# Patient Record
Sex: Female | Born: 1995 | Race: White | Hispanic: No | Marital: Single
Health system: Southern US, Community
[De-identification: ages and names within clinical notes are randomized; demographics above are authoritative.]

## PROBLEM LIST (undated history)

## (undated) DIAGNOSIS — F329 Major depressive disorder, single episode, unspecified: Secondary | ICD-10-CM

## (undated) DIAGNOSIS — F32A Depression, unspecified: Secondary | ICD-10-CM

## (undated) DIAGNOSIS — F419 Anxiety disorder, unspecified: Secondary | ICD-10-CM

---
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
## 2017-01-29 ENCOUNTER — Other Ambulatory Visit: Payer: Self-pay | Admitting: Student

## 2017-01-29 DIAGNOSIS — R1030 Lower abdominal pain, unspecified: Secondary | ICD-10-CM

## 2017-01-29 DIAGNOSIS — R1013 Epigastric pain: Secondary | ICD-10-CM

## 2017-01-29 DIAGNOSIS — R112 Nausea with vomiting, unspecified: Secondary | ICD-10-CM

## 2017-02-05 ENCOUNTER — Ambulatory Visit
Admission: RE | Admit: 2017-02-05 | Discharge: 2017-02-05 | Disposition: A | Discharge status: Home / Self Care | Payer: Medicaid Other | Source: Ambulatory Visit | Attending: Student | Admitting: Student

## 2017-02-05 ENCOUNTER — Other Ambulatory Visit
Admission: RE | Admit: 2017-02-05 | Discharge: 2017-02-05 | Disposition: A | Discharge status: Home / Self Care | Payer: Medicaid Other | Source: Ambulatory Visit | Attending: Student | Admitting: Student

## 2017-02-05 DIAGNOSIS — Z3200 Encounter for pregnancy test, result unknown: Secondary | ICD-10-CM | POA: Insufficient documentation

## 2017-02-05 LAB — URINALYSIS, COMPLETE (UACMP) WITH MICROSCOPIC
Bacteria, UA: NONE SEEN
Bilirubin Urine: NEGATIVE
Glucose, UA: NEGATIVE mg/dL
Hgb urine dipstick: NEGATIVE
KETONES UR: NEGATIVE mg/dL
Nitrite: NEGATIVE
PROTEIN: NEGATIVE mg/dL
Specific Gravity, Urine: 1.023 (ref 1.005–1.030)
pH: 6 (ref 5.0–8.0)

## 2017-02-06 ENCOUNTER — Other Ambulatory Visit
Admission: RE | Admit: 2017-02-06 | Discharge: 2017-02-06 | Disposition: A | Discharge status: Home / Self Care | Payer: Medicaid Other | Source: Ambulatory Visit | Attending: Student | Admitting: Student

## 2017-02-06 ENCOUNTER — Ambulatory Visit
Admission: RE | Admit: 2017-02-06 | Discharge: 2017-02-06 | Disposition: A | Discharge status: Home / Self Care | Payer: Medicaid Other | Source: Ambulatory Visit | Attending: Student | Admitting: Student

## 2017-02-06 DIAGNOSIS — R112 Nausea with vomiting, unspecified: Secondary | ICD-10-CM | POA: Diagnosis present

## 2017-02-06 DIAGNOSIS — R1013 Epigastric pain: Secondary | ICD-10-CM

## 2017-02-06 DIAGNOSIS — R195 Other fecal abnormalities: Secondary | ICD-10-CM | POA: Insufficient documentation

## 2017-02-06 DIAGNOSIS — R109 Unspecified abdominal pain: Secondary | ICD-10-CM | POA: Diagnosis present

## 2017-02-06 DIAGNOSIS — R1030 Lower abdominal pain, unspecified: Secondary | ICD-10-CM

## 2017-02-06 DIAGNOSIS — N281 Cyst of kidney, acquired: Secondary | ICD-10-CM | POA: Diagnosis not present

## 2017-02-06 DIAGNOSIS — Z3200 Encounter for pregnancy test, result unknown: Secondary | ICD-10-CM | POA: Diagnosis not present

## 2017-02-06 DIAGNOSIS — Z01818 Encounter for other preprocedural examination: Secondary | ICD-10-CM | POA: Insufficient documentation

## 2017-02-06 LAB — PREGNANCY, URINE: Preg Test, Ur: NEGATIVE

## 2017-02-06 MED ORDER — IOPAMIDOL (ISOVUE-300) INJECTION 61%
100.0000 mL | Freq: Once | INTRAVENOUS | Status: AC | PRN
  Administered 2017-02-06: 100 mL via INTRAVENOUS

## 2017-07-25 ENCOUNTER — Encounter: Payer: Self-pay | Admitting: Behavioral Health

## 2017-07-25 ENCOUNTER — Emergency Department
Admission: EM | Admit: 2017-07-25 | Discharge: 2017-07-25 | Disposition: A | Discharge status: Home / Self Care | Payer: Medicaid Other | Attending: Emergency Medicine | Admitting: Emergency Medicine

## 2017-07-25 ENCOUNTER — Other Ambulatory Visit: Payer: Self-pay

## 2017-07-25 DIAGNOSIS — F331 Major depressive disorder, recurrent, moderate: Secondary | ICD-10-CM

## 2017-07-25 DIAGNOSIS — F1210 Cannabis abuse, uncomplicated: Secondary | ICD-10-CM | POA: Diagnosis not present

## 2017-07-25 DIAGNOSIS — R030 Elevated blood-pressure reading, without diagnosis of hypertension: Secondary | ICD-10-CM | POA: Insufficient documentation

## 2017-07-25 DIAGNOSIS — Z046 Encounter for general psychiatric examination, requested by authority: Secondary | ICD-10-CM | POA: Diagnosis present

## 2017-07-25 DIAGNOSIS — F4310 Post-traumatic stress disorder, unspecified: Secondary | ICD-10-CM

## 2017-07-25 DIAGNOSIS — R45851 Suicidal ideations: Secondary | ICD-10-CM | POA: Insufficient documentation

## 2017-07-25 DIAGNOSIS — F329 Major depressive disorder, single episode, unspecified: Secondary | ICD-10-CM | POA: Diagnosis not present

## 2017-07-25 DIAGNOSIS — F32A Depression, unspecified: Secondary | ICD-10-CM

## 2017-07-25 DIAGNOSIS — R1115 Cyclical vomiting syndrome unrelated to migraine: Secondary | ICD-10-CM

## 2017-07-25 DIAGNOSIS — F603 Borderline personality disorder: Secondary | ICD-10-CM

## 2017-07-25 HISTORY — DX: Anxiety disorder, unspecified: F41.9

## 2017-07-25 HISTORY — DX: Major depressive disorder, single episode, unspecified: F32.9

## 2017-07-25 HISTORY — DX: Depression, unspecified: F32.A

## 2017-07-25 LAB — URINALYSIS, COMPLETE (UACMP) WITH MICROSCOPIC
BACTERIA UA: NONE SEEN
Bilirubin Urine: NEGATIVE
Glucose, UA: NEGATIVE mg/dL
KETONES UR: NEGATIVE mg/dL
Leukocytes, UA: NEGATIVE
Nitrite: NEGATIVE
Protein, ur: NEGATIVE mg/dL
Specific Gravity, Urine: 1.009 (ref 1.005–1.030)
pH: 6 (ref 5.0–8.0)

## 2017-07-25 LAB — COMPREHENSIVE METABOLIC PANEL
ALK PHOS: 79 U/L (ref 38–126)
ALT: 26 U/L (ref 14–54)
AST: 29 U/L (ref 15–41)
Albumin: 4.4 g/dL (ref 3.5–5.0)
Anion gap: 11 (ref 5–15)
BUN: 9 mg/dL (ref 6–20)
CALCIUM: 9.9 mg/dL (ref 8.9–10.3)
CHLORIDE: 109 mmol/L (ref 101–111)
CO2: 23 mmol/L (ref 22–32)
CREATININE: 0.64 mg/dL (ref 0.44–1.00)
GFR calc Af Amer: >60 mL/min (ref >60)
GFR calc non Af Amer: >60 mL/min (ref >60)
Glucose, Bld: 115 mg/dL — ABNORMAL HIGH (ref 65–99)
Potassium: 3.3 mmol/L — ABNORMAL LOW (ref 3.5–5.1)
SODIUM: 143 mmol/L (ref 135–145)
Total Bilirubin: 0.9 mg/dL (ref 0.3–1.2)
Total Protein: 8.4 g/dL — ABNORMAL HIGH (ref 6.5–8.1)

## 2017-07-25 LAB — URINE DRUG SCREEN, QUALITATIVE (ARMC ONLY)
AMPHETAMINES, UR SCREEN: NOT DETECTED
BENZODIAZEPINE, UR SCRN: NOT DETECTED
Barbiturates, Ur Screen: NOT DETECTED
Cannabinoid 50 Ng, Ur ~~LOC~~: POSITIVE — AB
Cocaine Metabolite,Ur ~~LOC~~: NOT DETECTED
MDMA (Ecstasy)Ur Screen: NOT DETECTED
METHADONE SCREEN, URINE: NOT DETECTED
Opiate, Ur Screen: NOT DETECTED
PHENCYCLIDINE (PCP) UR S: NOT DETECTED
TRICYCLIC, UR SCREEN: NOT DETECTED

## 2017-07-25 LAB — CBC
HCT: 44.5 % (ref 35.0–47.0)
HEMOGLOBIN: 14.4 g/dL (ref 12.0–16.0)
MCH: 27.3 pg (ref 26.0–34.0)
MCHC: 32.4 g/dL (ref 32.0–36.0)
MCV: 84.2 fL (ref 80.0–100.0)
PLATELETS: 293 10*3/uL (ref 150–440)
RBC: 5.29 MIL/uL — AB (ref 3.80–5.20)
RDW: 15.4 % — ABNORMAL HIGH (ref 11.5–14.5)
WBC: 10.2 10*3/uL (ref 3.6–11.0)

## 2017-07-25 LAB — SALICYLATE LEVEL

## 2017-07-25 LAB — POCT PREGNANCY, URINE: Preg Test, Ur: NEGATIVE

## 2017-07-25 LAB — ETHANOL: Alcohol, Ethyl (B): 74 mg/dL — ABNORMAL HIGH (ref <10)

## 2017-07-25 LAB — ACETAMINOPHEN LEVEL: Acetaminophen (Tylenol), Serum: <10 ug/mL — ABNORMAL LOW (ref 10–30)

## 2017-07-25 MED ORDER — ONDANSETRON 4 MG PO TBDP
4.0000 mg | ORAL_TABLET | Freq: Once | ORAL | Status: AC
  Administered 2017-07-25: 4 mg via ORAL

## 2017-07-25 MED ORDER — ONDANSETRON 4 MG PO TBDP
ORAL_TABLET | ORAL | Status: AC
  Filled 2017-07-25: qty 1

## 2017-07-25 NOTE — BH Assessment (Signed)
Per Dr. Toni Amendlapacs, pt will be discharged.

## 2017-07-25 NOTE — BH Assessment (Signed)
Assessment Note  Stacey Jensen is an 22 y.o. female presenting to the ED under IVC by [facility] for depression and suicidal ideations without plan or intent.  Patient currently denies SI.  She states that she sent her sister a text stating "I know I will eventually die one day but not tonight".    Patient has a family history of trauma and turmoil.  Patient's father committed suicide when patient was in the ninth grade.  Her sister had a suicide attempt 1 month ago and required admission to ICU.  Patient's mother left the family when patient and her sister were young.  Patient and her sister were raised by their grandparents.  Patient reports receiving medication management at [facility] but says she does not like taking medications and flushed them down the toilet.  She reports receiving outpatient therapy in the past but says she stopped when she started feeling better.  Patient denies HI and auditory/visual hallucinations.  She denies regular drug use.  UDS was positive for cannabis and ETOH.  Patient was calm and cooperative, although slightly irritated that her sister had her IVCd.  Diagnosis: Major Depressive Disorder  Past Medical History: No past medical history on file.    Family History: No family history on file.  Social History:  has no tobacco, alcohol, and drug history on file.  Additional Social History:  Alcohol / Drug Use Pain Medications: See PTA Prescriptions: See PTA Over the Counter: See PTA History of alcohol / drug use?: No history of alcohol / drug abuse  CIWA: CIWA-Ar BP: (!) 142/78 Pulse Rate: 81 COWS:    Allergies: No Known Allergies  Home Medications:  (Not in a hospital admission)  OB/GYN Status:  No LMP recorded.  General Assessment Data Location of Assessment: [facility] ED TTS Assessment: In system Is this a Tele or Face-to-Face Assessment?: Face-to-Face Is this an Initial Assessment or a Re-assessment for this encounter?: Initial  Assessment Marital status: Single Maiden name: n/a Is patient pregnant?: No Pregnancy Status: No Living Arrangements: Spouse/significant other Can pt return to current living arrangement?: Yes Admission Status: Involuntary Is patient capable of signing voluntary admission?: No Referral Source: Self/Family/Friend Insurance type: Medicaid     Crisis Care Plan Living Arrangements: Spouse/significant other Legal Guardian: Other:(self) Name of Psychiatrist: [facility] Name of Therapist: [facility]  Education Status Is patient currently in school?: No Current Grade: college Highest grade of school patient has completed: 12 Name of school: Ambulance person person: n/a  Risk to self with the past 6 months Suicidal Ideation: Yes-Currently Present Has patient been a risk to self within the past 6 months prior to admission? : No Suicidal Intent: No-Not Currently/Within Last 6 Months Has patient had any suicidal intent within the past 6 months prior to admission? : No Is patient at risk for suicide?: No Suicidal Plan?: No Has patient had any suicidal plan within the past 6 months prior to admission? : No Access to Means: No What has been your use of drugs/alcohol within the last 12 months?: cannabis and alcohol Previous Attempts/Gestures: No Other Self Harm Risks: none identified Triggers for Past Attempts: None known Intentional Self Injurious Behavior: None Family Suicide History: Yes Recent stressful life event(s): Conflict (Comment), Loss (Comment) Persecutory voices/beliefs?: No Depression: Yes Depression Symptoms: Loss of interest in usual pleasures, Feeling angry/irritable Substance abuse history and/or treatment for substance abuse?: No Suicide prevention information given to non-admitted patients: Not applicable  Risk to Others within the past 6 months Homicidal Ideation:  No Does patient have any lifetime risk of violence toward others beyond the six months prior to admission? :  No Thoughts of Harm to Others: No Current Homicidal Intent: No Current Homicidal Plan: No Access to Homicidal Means: No Identified Victim: none identified History of harm to others?: No Assessment of Violence: None Noted Does patient have access to weapons?: No Criminal Charges Pending?: No Does patient have a court date: No Is patient on probation?: No  Psychosis Hallucinations: None noted Delusions: None noted  Mental Status Report Appearance/Hygiene: In scrubs Eye Contact: Good Motor Activity: Freedom of movement Speech: Logical/coherent Level of Consciousness: Alert Mood: Anxious, Irritable Affect: Appropriate to circumstance, Irritable, Anxious Anxiety Level: Minimal Thought Processes: Relevant, Coherent Judgement: Partial Orientation: Person, Place, Time, Situation, Appropriate for developmental age Obsessive Compulsive Thoughts/Behaviors: None  Cognitive Functioning Concentration: Normal Memory: Recent Intact, Remote Intact IQ: Average Insight: Poor Impulse Control: Poor Appetite: Fair Weight Loss: 0 Weight Gain: 0 Sleep: No Change Vegetative Symptoms: None  ADLScreening [facility] Assessment Services) Patient able to express need for assistance with ADLs?: Yes Independently performs ADLs?: Yes (appropriate for developmental age)  Prior Inpatient Therapy Prior Inpatient Therapy: No Prior Therapy Dates: n/a Prior Therapy Facilty/Provider(s): n/a Reason for Treatment: n/a  Prior Outpatient Therapy Prior Outpatient Therapy: Yes Prior Therapy Dates: current Prior Therapy Facilty/Provider(s): [facility] Reason for Treatment: depression Does patient have an ACCT team?: No Does patient have Intensive In-House Services?  : No Does patient have [facility] services? : No Does patient have P4CC services?: No  ADL Screening (condition at time of admission) Is the patient deaf or have difficulty hearing?: No Does the patient have difficulty seeing, even when wearing  glasses/contacts?: No Does the patient have difficulty concentrating, remembering, or making decisions?: No Patient able to express need for assistance with ADLs?: Yes Does the patient have difficulty dressing or bathing?: No Independently performs ADLs?: Yes (appropriate for developmental age) Does the patient have difficulty walking or climbing stairs?: No Weakness of Legs: None Weakness of Arms/Hands: None  Home Assistive Devices/Equipment Home Assistive Devices/Equipment: None  Therapy Consults (therapy consults require a physician order) PT Evaluation Needed: No OT Evalulation Needed: No SLP Evaluation Needed: No Abuse/Neglect Assessment (Assessment to be complete while patient is alone) Abuse/Neglect Assessment Can Be Completed: Yes Physical Abuse: Denies Verbal Abuse: Denies Sexual Abuse: Denies Exploitation of patient/patient's resources: Denies Self-Neglect: Denies Values / Beliefs Cultural Requests During Hospitalization: None Spiritual Requests During Hospitalization: None Consults Spiritual Care Consult Needed: No Social Work Consult Needed: No Merchant navy officerAdvance Directives (For Healthcare) Does Patient Have a Medical Advance Directive?: No Would patient like information on creating a medical advance directive?: No - Patient declined    Additional Information 1:1 In Past 12 Months?: No CIRT Risk: No Elopement Risk: No Does patient have medical clearance?: Yes     Disposition:  Disposition Initial Assessment Completed for this Encounter: Yes Disposition of Patient: Pending Review with psychiatrist  On Site Evaluation by:   Reviewed with Physician:    Artist Beachoxana C Glenys Snader 07/25/2017 3:46 AM

## 2017-07-25 NOTE — Consult Note (Signed)
[place] Psychiatry Consult   Reason for Consult: Consult for 22 year old woman with a history of mental health issues who came into the hospital under IVC Referring Physician: Mariea Clonts Patient Identification: Stacey Jensen MRN:  [id number] Principal Diagnosis: Moderate recurrent major depression ([place]) Diagnosis:   Patient Active Problem List   Diagnosis Date Noted  . PTSD (post-traumatic stress disorder) [F43.10] 07/25/2017  . 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 Borderline personality disorder ([place]) [F60.3] 07/25/2017  . Cyclic vomiting syndrome [G43.A0] 07/25/2017  . Cannabis abuse [F12.10] 07/25/2017    Total Time spent with patient: 1 hour  Subjective:   Stacey Jensen is a 22 y.o. female patient admitted with "I am not suicidal".  HPI: Patient interviewed chart reviewed.  22 year old woman was petition by her family.  Apparently she sent a text message to her sister which the sister felt was worrisome.  The patient insists that the actual content of the message was "I am not going to kill myself tonight but if I ever did remember that I love you".  Patient states that she was angry at her sister because her sister had been worried about her and fussing over her.  The patient actually goes to some length to blame her sister for the whole situation.  Patient admits her mood is anxious and somewhat dysphoric at time but denies being persistently sad.  She says outside the hospital she sleeps okay.  Does not have any problem with appetite.  Denies other current medical issues.  Patient has been going to [facility] for outpatient treatment for mood and anxiety but recently flushed all of her medicine down the toilet in an impulsive gesture.  Patient denies any psychotic symptoms.  Denies any homicidal ideation.  She denies any alcohol or drug use at first although her alcohol level was positive at 70 and her drug screen was positive for cannabis.  Eventually she admits  that she smokes marijuana every day but justifies it as a treatment for her "cyclic vomiting".  Social history: Patient lives with her boyfriend.  Sounds like she has a tumultuous relationship with a lot of her family.  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  Medical history: She says she is on medical leave for cyclic vomiting.  She sees a gastroenterologist.  Substance abuse history: Patient is not being completely honest with me.  She has a positive alcohol level but swore to me that she had had no alcohol to drink any time recently.  She does use marijuana regularly and does not see it as a problem Past Psychiatric History: No prior inpatient treatment.  Denies any past suicide attempts.  Has been going to [facility] for treatment of anxiety depression and nightmares.  Was supposed to be taking lamotrigine but threw it all away.  She has been on other antidepressants in the past as well.  Risk to Self: Suicidal Ideation: Yes-Currently Present Suicidal Intent: No-Not Currently/Within Last 6 Months Is patient at risk for suicide?: No Suicidal Plan?: No Access to Means: No What has been your use of drugs/alcohol within the last 12 months?: cannabis and alcohol Other Self Harm Risks: none identified Triggers for Past Attempts: None known Intentional Self Injurious Behavior: None Risk to Others: Homicidal Ideation: No Thoughts of Harm to Others: No Current Homicidal Intent: No Current Homicidal Plan: No Access to Homicidal Means: No Identified Victim: none identified History of  harm to others?: No Assessment of Violence: None Noted Does patient have access to weapons?: No Criminal Charges Pending?: No Does patient have a court date: No Prior Inpatient Therapy: Prior Inpatient Therapy: No Prior Therapy Dates: n/a Prior Therapy Facilty/Provider(s): n/a Reason for Treatment: n/a Prior Outpatient Therapy: Prior Outpatient  Therapy: Yes Prior Therapy Dates: current Prior Therapy Facilty/Provider(s): [facility] Reason for Treatment: depression Does patient have an ACCT team?: No Does patient have Intensive In-House Services?  : No Does patient have [facility] services? : No Does patient have P4CC services?: No  Past Medical History:  Past Medical History:  Diagnosis Date  . Anxiety   . Depression    s are not reviewed yet. Please review them in the "History" navigator section and refresh this [place]. Family History: No family history on file. Family Psychiatric  History: Patient's father killed himself several years ago.  Sounds like he had mood problems and substance abuse issues.  Patient claims that her sister has also made suicide attempts Social History:  Social History   Substance and Sexual Activity  Alcohol Use Yes  . Alcohol/week: 0.6 oz  . Types: 1 Standard drinks or equivalent per week     Social History   Substance and Sexual Activity  Drug Use Yes  . Types: Marijuana    Social History   Socioeconomic History  . Marital status: Single    Spouse name: Not on file  . Number of children: Not on file  . Years of education: Not on file  . Highest education level: Not on file  Social Needs  . Financial resource strain: Not on file  . Food insecurity - worry: Not on file  . Food insecurity - inability: Not on file  . Transportation needs - medical: Not on file  . Transportation needs - non-medical: Not on file  Occupational History  . Not on file  Tobacco Use  . Smoking status: Not on file  Substance and Sexual Activity  . Alcohol use: Yes    Alcohol/week: 0.6 oz    Types: 1 Standard drinks or equivalent per week  . Drug use: Yes    Types: Marijuana  . Sexual activity: Yes    Birth control/protection: None  Other Topics Concern  . Not on file  Social History Narrative  . Not on file   Additional Social History:    Allergies:  No Known Allergies  Labs:  Results for orders  placed or performed during the hospital encounter of 07/25/17 (from the past 48 hour(s))  CBC     Status: Abnormal   Collection Time: 07/25/17 12:58 AM  Result Value Ref Range   WBC 10.2 3.6 - 11.0 K/uL   RBC 5.29 (H) 3.80 - 5.20 MIL/uL   Hemoglobin 14.4 12.0 - 16.0 g/dL   HCT 44.5 35.0 - 47.0 %   MCV 84.2 80.0 - 100.0 fL   MCH 27.3 26.0 - 34.0 pg   MCHC 32.4 32.0 - 36.0 g/dL   RDW 15.4 (H) 11.5 - 14.5 %   Platelets 293 150 - 440 K/uL    Comment: Performed at [facility], [address] [place], [place] [address]  Comprehensive metabolic panel     Status: Abnormal   Collection Time: 07/25/17 12:58 AM  Result Value Ref Range   Sodium 143 135 - 145 mmol/L   Potassium 3.3 (L) 3.5 - 5.1 mmol/L   Chloride 109 101 - 111 mmol/L   CO2 23 22 - 32 mmol/L  Glucose, Bld 115 (H) 65 - 99 mg/dL   BUN 9 6 - 20 mg/dL   Creatinine, Ser 0.64 0.44 - 1.00 mg/dL   Calcium 9.9 8.9 - 10.3 mg/dL   Total Protein 8.4 (H) 6.5 - 8.1 g/dL   Albumin 4.4 3.5 - 5.0 g/dL   AST 29 15 - 41 U/L   ALT 26 14 - 54 U/L   Alkaline Phosphatase 79 38 - 126 U/L   Total Bilirubin 0.9 0.3 - 1.2 mg/dL   GFR calc non Af Amer >60 >60 mL/min   GFR calc Af Amer >60 >60 mL/min    Comment: (NOTE) The eGFR has been calculated using the CKD EPI equation. This calculation has not been validated in all clinical situations. eGFR's persistently <60 mL/min signify possible Chronic Kidney Disease.    Anion gap 11 5 - 15    Comment: Performed at [facility], [address] [place], [place] [address]  Ethanol     Status: Abnormal   Collection Time: 07/25/17 12:58 AM  Result Value Ref Range   Alcohol, Ethyl (B) 74 (H) <10 mg/dL    Comment:        LOWEST DETECTABLE LIMIT FOR SERUM ALCOHOL IS 10 mg/dL FOR MEDICAL PURPOSES ONLY Performed at [facility], [address], [place], [place] [address]   Acetaminophen level     Status: Abnormal   Collection Time: 07/25/17 12:58 AM  Result Value Ref  Range   Acetaminophen (Tylenol), Serum <10 (L) 10 - 30 ug/mL    Comment:        THERAPEUTIC CONCENTRATIONS VARY SIGNIFICANTLY. A RANGE OF 10-30 ug/mL MAY BE AN EFFECTIVE CONCENTRATION FOR MANY PATIENTS. HOWEVER, SOME ARE BEST TREATED AT CONCENTRATIONS OUTSIDE THIS RANGE. ACETAMINOPHEN CONCENTRATIONS >150 ug/mL AT 4 HOURS AFTER INGESTION AND >50 ug/mL AT 12 HOURS AFTER INGESTION ARE OFTEN ASSOCIATED WITH TOXIC REACTIONS. Performed at [facility], [address] [place], [place] [address]   Salicylate level     Status: None   Collection Time: 07/25/17 12:58 AM  Result Value Ref Range   Salicylate Lvl <2.2 2.8 - 30.0 mg/dL    Comment: Performed at [facility], [address] [place],  [address]  Urinalysis, Complete w Microscopic     Status: Abnormal   Collection Time: 07/25/17 12:59 AM  Result Value Ref Range   Color, Urine YELLOW (A) YELLOW   APPearance CLEAR (A) CLEAR   Specific Gravity, Urine 1.009 1.005 - 1.030   pH 6.0 5.0 - 8.0   Glucose, UA NEGATIVE NEGATIVE mg/dL   Hgb urine dipstick MODERATE (A) NEGATIVE   Bilirubin Urine NEGATIVE NEGATIVE   Ketones, ur NEGATIVE NEGATIVE mg/dL   Protein, ur NEGATIVE NEGATIVE mg/dL   Nitrite NEGATIVE NEGATIVE   Leukocytes, UA NEGATIVE NEGATIVE   RBC / HPF 0-5 0 - 5 RBC/hpf   WBC, UA 0-5 0 - 5 WBC/hpf   Bacteria, UA NONE SEEN NONE SEEN   Squamous Epithelial / LPF 0-5 (A) NONE SEEN   Mucus PRESENT     Comment: Performed at [facility], [address] [place],  [address]  Urine Drug Screen, Qualitative ([facility] only)     Status: Abnormal   Collection Time: 07/25/17 12:59 AM  Result Value Ref Range   Tricyclic, Ur Screen NONE DETECTED NONE DETECTED   Amphetamines, Ur Screen NONE DETECTED NONE DETECTED   MDMA (Ecstasy)Ur Screen NONE DETECTED NONE DETECTED   Cocaine Metabolite,Ur [place] NONE DETECTED NONE DETECTED  Opiate, Ur Screen NONE DETECTED NONE DETECTED   Phencyclidine (PCP) Ur S  NONE DETECTED NONE DETECTED   Cannabinoid 50 Ng, Ur [place] POSITIVE (A) NONE DETECTED   Barbiturates, Ur Screen NONE DETECTED NONE DETECTED   Benzodiazepine, Ur Scrn NONE DETECTED NONE DETECTED   Methadone Scn, Ur NONE DETECTED NONE DETECTED    Comment: (NOTE) Tricyclics + metabolites, urine    Cutoff 1000 ng/mL Amphetamines + metabolites, urine  Cutoff 1000 ng/mL MDMA (Ecstasy), urine              Cutoff 500 ng/mL Cocaine Metabolite, urine          Cutoff 300 ng/mL Opiate + metabolites, urine        Cutoff 300 ng/mL Phencyclidine (PCP), urine         Cutoff 25 ng/mL Cannabinoid, urine                 Cutoff 50 ng/mL Barbiturates + metabolites, urine  Cutoff 200 ng/mL Benzodiazepine, urine              Cutoff 200 ng/mL Methadone, urine                   Cutoff 300 ng/mL The urine drug screen provides only a preliminary, unconfirmed analytical test result and should not be used for non-medical purposes. Clinical consideration and professional judgment should be applied to any positive drug screen result due to possible interfering substances. A more specific alternate chemical method must be used in order to obtain a confirmed analytical result. Gas chromatography / mass spectrometry (GC/MS) is the preferred confirmat ory method. Performed at [facility], [address] [place], [place] [address]   Pregnancy, urine POC     Status: None   Collection Time: 07/25/17  1:22 AM  Result Value Ref Range   Preg Test, Ur NEGATIVE NEGATIVE    Comment:        THE SENSITIVITY OF THIS METHODOLOGY IS >24 mIU/mL     No current facility-administered medications for this encounter.    Current Outpatient Medications  Medication Sig Dispense Refill  . cloNIDine (CATAPRES) 0.2 MG tablet Take 0.2 mg by mouth at bedtime.  0  . lamoTRIgine (LAMICTAL) 25 MG tablet Take 75 mg by mouth daily.  0  . MILI 0.25-35 MG-MCG tablet Take 1 tablet by mouth as directed.  1  . prazosin (MINIPRESS) 1 MG  capsule Take 1 mg by mouth 2 (two) times daily.   0    Musculoskeletal: Strength & Muscle Tone: within normal limits Gait & Station: normal Patient leans: N/A  Psychiatric Specialty Exam: Physical Exam  Nursing note and vitals reviewed. Constitutional: She appears well-developed and well-nourished.  HENT:  Head: Normocephalic and atraumatic.  Eyes: Conjunctivae are normal. Pupils are equal, round, and reactive to light.  Neck: Normal range of motion.  Cardiovascular: Regular rhythm and normal heart sounds.  Respiratory: Effort normal. No respiratory distress.  GI: Soft. She exhibits no distension.  Musculoskeletal: Normal range of motion.  Neurological: She is alert.  Skin: Skin is warm and dry.  Psychiatric: Her speech is normal. Her mood appears anxious. Her affect is labile. She is agitated. She is not aggressive and not combative. Thought content is not paranoid and not delusional. Cognition and memory are normal. She expresses impulsivity. She expresses no homicidal and no suicidal ideation.    Review of Systems  Constitutional: Negative.   HENT: Negative.   Eyes: Negative.   Respiratory: Negative.  Cardiovascular: Negative.   Gastrointestinal: Negative.   Musculoskeletal: Negative.   Skin: Negative.   Neurological: Negative.   Psychiatric/Behavioral: Negative for depression, hallucinations, memory loss, substance abuse and suicidal ideas. The patient is nervous/anxious. The patient does not have insomnia.     Blood pressure (!) 142/78, pulse 81, temperature 97.9 F (36.6 C), temperature source Oral, resp. rate 20, height 5' 8"  (1.727 m), weight 72.6 kg (160 lb), SpO2 99 %.Body mass index is 24.33 kg/m.  General Appearance: Fairly Groomed  Eye Contact:  Fair  Speech:  Clear and Coherent  Volume:  Increased  Mood:  Irritable  Affect:  Congruent  Thought Process:  Goal Directed  Orientation:  Full (Time, Place, and Person)  Thought Content:  Logical and Tangential   Suicidal Thoughts:  No  Homicidal Thoughts:  No  Memory:  Immediate;   Good Recent;   Fair Remote;   Fair  Judgement:  Impaired  Insight:  Shallow  Psychomotor Activity:  Normal  Concentration:  Concentration: Fair  Recall:  [employer] of Knowledge:  Fair  Language:  Fair  Akathisia:  No  Handed:  Right  AIMS (if indicated):     Assets:  Desire for Improvement Housing Physical Health Resilience  ADL's:  Intact  Cognition:  WNL  Sleep:        Treatment Plan Summary: Plan 22 year old woman who came into the hospital under IVC but there is no evidence that she actually tried to hurt her self or made any threat to hurt her self.  She clearly has chronic problems with mood and anxiety but is not psychotic.  She is enrolled at [facility] and says that she is agreeable to continuing to follow up there.  Patient had a slightly elevated alcohol level but is not intoxicated.  Patient does not meet commitment criteria and I have discontinued the commitment order.  Patient is strongly advised to follow-up with outpatient mental health treatment and to stop alcohol abuse.  Case reviewed with emergency room physician and TTS.  Patient can be released from the ER at the emergency room physician's discretion.  Disposition: No evidence of imminent risk to self or others at present.   Patient does not meet criteria for psychiatric inpatient admission. Supportive therapy provided about ongoing stressors.  Alethia Berthold, MD 07/25/2017 4:00 PM

## 2017-07-25 NOTE — ED Notes (Signed)
EDT, Maralyn SagoSarah and Trenton FoundsKadijah to triage to complete protocols and change pt into behav scrubs

## 2017-07-25 NOTE — ED Notes (Signed)
IVC  PAPERS  RESCINDED PER  DR  CLAPACS  MD INFORMED  RN [facility]I IN  [facility]  [facility]

## 2017-07-25 NOTE — Consult Note (Signed)
Psychiatry: Patient seen.  Consult completed.  Spoke with emergency room physician.  Full note will follow.  Patient with a history of depression and anxiety but who does not currently meet commitment criteria.  Discontinued IV C.  Counseling completed.  [facility].

## 2017-07-25 NOTE — ED Notes (Signed)
Pt's boyfriend requesting his contact # be available to pt for d/c transportation when needed. Stacey Jensen [phone]

## 2017-07-25 NOTE — ED Triage Notes (Signed)
[facility] [place] [facility] [facility]

## 2017-07-25 NOTE — ED Provider Notes (Signed)
[facility] Emergency Department Provider Note   ____________________________________________   First MD Initiated Contact with Patient 07/25/17 0134     (approximate)  I have reviewed the triage vital signs and the nursing notes.   HISTORY  Chief Complaint Psychiatric Evaluation    HPI Stacey Jensen is a 22 y.o. female [place] with suicidal ideation.  Patient has a strong family history of suicide her father committed suicide when she was in ninth grade and her sister attempted suicide 1 month ago.  Her mother is not in the picture because she left when patient was young.  Tonight patient was texting with her sister who was concerned that she was texting a goodbye message.  Patient currently denies active SI/HI/AH/VH.  Had to drop out of school this semester secondary to chronic GI issues.  States she flushed all of her medical and psychiatric medicines down the toilet this week because she feels like they are not helping her.  Voices no complaints currently.   Past medical history Depression Abdominal pain Nausea/vomiting  There are no active problems to display for this patient.    Prior to Admission medications   Not on File    Allergies Patient has no known allergies.  No family history on file.  Social History Social History   Tobacco Use  . Smoking status: Not on file  Substance Use Topics  . Alcohol use: Yes    Alcohol/week: 0.6 oz    Types: 1 Standard drinks or equivalent per week  . Drug use: Yes    Types: Marijuana    Review of Systems  Constitutional: No fever/chills. Eyes: No visual changes. ENT: No sore throat. Cardiovascular: Denies chest pain. Respiratory: Denies shortness of breath. Gastrointestinal: No abdominal pain.  No nausea, no vomiting.  No diarrhea.  No constipation. Genitourinary: Negative for dysuria. Musculoskeletal: Negative for back pain. Skin:  Negative for rash. Neurological: Negative for headaches, focal weakness or numbness. Psychiatric:Positive for depression with suicidal thoughts.  ____________________________________________   PHYSICAL EXAM:  VITAL SIGNS: ED Triage Vitals  Enc Vitals Group     BP 07/25/17 0059 (!) 142/78     Pulse Rate 07/25/17 0059 81     Resp 07/25/17 0059 20     Temp 07/25/17 0059 97.9 F (36.6 C)     Temp Source 07/25/17 0059 Oral     SpO2 07/25/17 0059 99 %     Weight 07/25/17 0057 160 lb (72.6 kg)     Height 07/25/17 0057 5\' 8"  (1.727 m)     Head Circumference --      Peak Flow --      Pain Score --      Pain Loc --      Pain Edu? --      Excl. in GC? --     Constitutional: Alert and oriented. Well appearing and in no acute distress. Eyes: Conjunctivae are normal. PERRL. EOMI. Head: Atraumatic. Nose: No congestion/rhinnorhea. Mouth/Throat: Mucous membranes are moist.  Oropharynx non-erythematous. Neck: No stridor.   Cardiovascular: Normal rate, regular rhythm. Grossly normal heart sounds.  Good peripheral circulation. Respiratory: Normal respiratory effort.  No retractions. Lungs CTAB. Gastrointestinal: Soft and nontender to light or deep palpation. No distention. No abdominal bruits. No CVA tenderness. Musculoskeletal: No lower extremity tenderness nor edema.  No joint effusions. Neurologic:  Normal speech and language. No gross focal neurologic deficits are appreciated. No gait instability. Skin:  Skin is warm, dry and intact. No rash noted. Psychiatric: Mood and affect are flat. Speech and behavior are normal.  ____________________________________________   LABS (all labs ordered are listed, but only abnormal results are displayed)  Labs Reviewed  CBC - Abnormal; Notable for the following components:      Result Value   RBC 5.29 (*)    RDW 15.4 (*)    All other components within normal limits  COMPREHENSIVE METABOLIC PANEL - Abnormal; Notable for the following  components:   Potassium 3.3 (*)    Glucose, Bld 115 (*)    Total Protein 8.4 (*)    All other components within normal limits  ETHANOL - Abnormal; Notable for the following components:   Alcohol, Ethyl (B) 74 (*)    All other components within normal limits  URINALYSIS, COMPLETE (UACMP) WITH MICROSCOPIC - Abnormal; Notable for the following components:   Color, Urine YELLOW (*)    APPearance CLEAR (*)    Hgb urine dipstick MODERATE (*)    Squamous Epithelial / LPF 0-5 (*)    All other components within normal limits  URINE DRUG SCREEN, QUALITATIVE ([facility] ONLY) - Abnormal; Notable for the following components:   Cannabinoid 50 Ng, Ur [place] POSITIVE (*)    All other components within normal limits  ACETAMINOPHEN LEVEL - Abnormal; Notable for the following components:   Acetaminophen (Tylenol), Serum <10 (*)    All other components within normal limits  SALICYLATE LEVEL  POC URINE PREG, ED  POCT PREGNANCY, URINE   ____________________________________________  EKG  None ____________________________________________  RADIOLOGY  ED MD interpretation: None  Official radiology report(s): No results found.  ____________________________________________   PROCEDURES  Procedure(s) performed: None  Procedures  Critical Care performed: No  ____________________________________________   INITIAL IMPRESSION / ASSESSMENT AND PLAN / ED COURSE  As part of my medical decision making, I reviewed the following data within the electronic MEDICAL RECORD NUMBER Nursing notes reviewed and incorporated, Labs reviewed, Old chart reviewed, A consult was requested and obtained from this/these consultant(s) Psychiatry and Notes from prior ED visits.   22 year old female who presents under IVC for depression with suicidal ideation.  Strong family history of suicidality.  Although patient is currently being worked up for chronic abdominal pain, she voices no complaints currently.  I offered to reorder  her home medications but she declines.  Will maintain patient's IVC pending TTS and psychiatry consultations.  She may be transferred to [facility] pending psychiatric disposition.  Clinical Course as of Jul 25 624  Thu Jul 25, 2017  0626 No further events.  Acetaminophen and salicylate levels unremarkable.  UDS remarkable for positive cannabinoids.  Awaiting psychiatric evaluation today.  [JS]    Clinical Course User Index [JS] Irean Hong, MD     ____________________________________________   FINAL CLINICAL IMPRESSION(S) / ED DIAGNOSES  Final diagnoses:  Depression, unspecified depression type  Moderate episode of recurrent major depressive disorder [facility])     ED Discharge Orders    None       Note:  This document was prepared using Dragon voice recognition software and may include unintentional dictation errors.    Irean Hong, MD 07/25/17 [phone]

## 2017-07-25 NOTE — ED Notes (Signed)
Pt. Moved from 19 H to #8 in the BHU.  Pt. Advised that this was a locked down unit with cameras in every room.  Pt. Also advised we would be making 15 min. Safety checks.  Pt. Denies SI or HI at this time.  Pt. Requesting to make a phone call to someone waiting for her in ED waiting room.  Pt. Told that she may not be talking to psychiatrist until morning.

## 2017-07-25 NOTE — ED Notes (Signed)
Discharge instructions given to patient.Verbalized understanding.Denies SI,HI and AVH.Belongings returned to patient.Patient escorted to lobby by staff.

## 2017-08-14 ENCOUNTER — Other Ambulatory Visit: Payer: Self-pay

## 2017-08-14 ENCOUNTER — Emergency Department: Payer: Medicaid Other

## 2017-08-14 ENCOUNTER — Emergency Department
Admission: EM | Admit: 2017-08-14 | Discharge: 2017-08-16 | Disposition: A | Discharge status: Home / Self Care | Payer: Medicaid Other | Attending: Emergency Medicine | Admitting: Emergency Medicine

## 2017-08-14 DIAGNOSIS — F329 Major depressive disorder, single episode, unspecified: Secondary | ICD-10-CM | POA: Insufficient documentation

## 2017-08-14 DIAGNOSIS — W19XXXA Unspecified fall, initial encounter: Secondary | ICD-10-CM | POA: Diagnosis not present

## 2017-08-14 DIAGNOSIS — Y92530 Ambulatory surgery center as the place of occurrence of the external cause: Secondary | ICD-10-CM | POA: Diagnosis not present

## 2017-08-14 DIAGNOSIS — S022XXA Fracture of nasal bones, initial encounter for closed fracture: Secondary | ICD-10-CM | POA: Diagnosis not present

## 2017-08-14 DIAGNOSIS — F331 Major depressive disorder, recurrent, moderate: Secondary | ICD-10-CM

## 2017-08-14 DIAGNOSIS — Z79899 Other long term (current) drug therapy: Secondary | ICD-10-CM | POA: Insufficient documentation

## 2017-08-14 DIAGNOSIS — R4182 Altered mental status, unspecified: Secondary | ICD-10-CM | POA: Insufficient documentation

## 2017-08-14 DIAGNOSIS — R451 Restlessness and agitation: Secondary | ICD-10-CM | POA: Insufficient documentation

## 2017-08-14 DIAGNOSIS — T50901A Poisoning by unspecified drugs, medicaments and biological substances, accidental (unintentional), initial encounter: Secondary | ICD-10-CM

## 2017-08-14 DIAGNOSIS — F1210 Cannabis abuse, uncomplicated: Secondary | ICD-10-CM | POA: Diagnosis present

## 2017-08-14 DIAGNOSIS — T50904A Poisoning by unspecified drugs, medicaments and biological substances, undetermined, initial encounter: Secondary | ICD-10-CM | POA: Diagnosis not present

## 2017-08-14 DIAGNOSIS — Y999 Unspecified external cause status: Secondary | ICD-10-CM | POA: Diagnosis not present

## 2017-08-14 DIAGNOSIS — Y939 Activity, unspecified: Secondary | ICD-10-CM | POA: Insufficient documentation

## 2017-08-14 DIAGNOSIS — F4310 Post-traumatic stress disorder, unspecified: Secondary | ICD-10-CM | POA: Diagnosis present

## 2017-08-14 DIAGNOSIS — F603 Borderline personality disorder: Secondary | ICD-10-CM | POA: Diagnosis present

## 2017-08-14 LAB — COMPREHENSIVE METABOLIC PANEL
ALBUMIN: 4 g/dL (ref 3.5–5.0)
ALK PHOS: 65 U/L (ref 38–126)
ALT: 25 U/L (ref 14–54)
ANION GAP: 5 (ref 5–15)
AST: 28 U/L (ref 15–41)
BILIRUBIN TOTAL: 1.2 mg/dL (ref 0.3–1.2)
BUN: 10 mg/dL (ref 6–20)
CALCIUM: 9.3 mg/dL (ref 8.9–10.3)
CO2: 26 mmol/L (ref 22–32)
CREATININE: 0.63 mg/dL (ref 0.44–1.00)
Chloride: 106 mmol/L (ref 101–111)
GFR calc Af Amer: >60 mL/min (ref >60)
GFR calc non Af Amer: >60 mL/min (ref >60)
Glucose, Bld: 167 mg/dL — ABNORMAL HIGH (ref 65–99)
Potassium: 3.5 mmol/L (ref 3.5–5.1)
SODIUM: 137 mmol/L (ref 135–145)
TOTAL PROTEIN: 7.7 g/dL (ref 6.5–8.1)

## 2017-08-14 LAB — URINE DRUG SCREEN, QUALITATIVE (ARMC ONLY)
Amphetamines, Ur Screen: NOT DETECTED
Barbiturates, Ur Screen: NOT DETECTED
Benzodiazepine, Ur Scrn: NOT DETECTED
CANNABINOID 50 NG, UR ~~LOC~~: POSITIVE — AB
Cocaine Metabolite,Ur ~~LOC~~: NOT DETECTED
MDMA (Ecstasy)Ur Screen: NOT DETECTED
Methadone Scn, Ur: NOT DETECTED
OPIATE, UR SCREEN: NOT DETECTED
PHENCYCLIDINE (PCP) UR S: NOT DETECTED
Tricyclic, Ur Screen: NOT DETECTED

## 2017-08-14 LAB — CBC
HEMATOCRIT: 40 % (ref 35.0–47.0)
HEMOGLOBIN: 12.8 g/dL (ref 12.0–16.0)
MCH: 26.7 pg (ref 26.0–34.0)
MCHC: 32 g/dL (ref 32.0–36.0)
MCV: 83.4 fL (ref 80.0–100.0)
Platelets: 288 10*3/uL (ref 150–440)
RBC: 4.8 MIL/uL (ref 3.80–5.20)
RDW: 15.3 % — ABNORMAL HIGH (ref 11.5–14.5)
WBC: 16.9 10*3/uL — ABNORMAL HIGH (ref 3.6–11.0)

## 2017-08-14 LAB — PREGNANCY, URINE: PREG TEST UR: NEGATIVE

## 2017-08-14 LAB — SALICYLATE LEVEL: Salicylate Lvl: <7 mg/dL (ref 2.8–30.0)

## 2017-08-14 LAB — MAGNESIUM: Magnesium: 1.6 mg/dL — ABNORMAL LOW (ref 1.7–2.4)

## 2017-08-14 LAB — ETHANOL

## 2017-08-14 LAB — ACETAMINOPHEN LEVEL: Acetaminophen (Tylenol), Serum: <10 ug/mL — ABNORMAL LOW (ref 10–30)

## 2017-08-14 LAB — PHOSPHORUS: PHOSPHORUS: 3.4 mg/dL (ref 2.5–4.6)

## 2017-08-14 MED ORDER — SODIUM CHLORIDE 0.9 % IV BOLUS (SEPSIS)
1000.0000 mL | Freq: Once | INTRAVENOUS | Status: AC
  Administered 2017-08-14: 1000 mL via INTRAVENOUS

## 2017-08-14 MED ORDER — LORAZEPAM 2 MG/ML IJ SOLN
1.0000 mg | Freq: Once | INTRAMUSCULAR | Status: AC
  Administered 2017-08-14: 1 mg via INTRAVENOUS
  Filled 2017-08-14: qty 1

## 2017-08-14 MED ORDER — MAGNESIUM SULFATE 2 GM/50ML IV SOLN
2.0000 g | Freq: Once | INTRAVENOUS | Status: AC
  Administered 2017-08-14: 2 g via INTRAVENOUS
  Filled 2017-08-14: qty 50

## 2017-08-14 MED ORDER — ZIPRASIDONE MESYLATE 20 MG IM SOLR
20.0000 mg | Freq: Once | INTRAMUSCULAR | Status: AC
  Administered 2017-08-14: 20 mg via INTRAMUSCULAR
  Filled 2017-08-14: qty 20

## 2017-08-14 NOTE — ED Notes (Signed)
Pt fell in room. No bed alarm noises off when into room. Pt must have climbed out end of bed, since both side rails up. EDP informed. Pt laceration to nose, bit lower lip. EDP at bedside to assess. Mattress placed on floor, patient moved to. Pt continues to call out for boyfriend, stating " I just want to leave". Charge RN informed. Sitter order placed. Bleeding to nose controlled at this time, no bleeding from lip. Pt face cleaned.

## 2017-08-14 NOTE — BH Assessment (Signed)
Assessment Note  Stacey Jensen is an 22 y.o. female who presents to the ER, via her boyfriend due to taking an intentional overdose of medications. Per ER staff, the patient communicated with them she took the medication due to her boyfriend being mean to her. She also shared, she has had previous attempts and her father was successful with ending his life. With this Clinical research associate, patient was unable to participate in the interview. She was able to provide her name, date of birth and her location. Other answers were difficult to understand or she didn't answer at all.  Per the report of the patient's boyfriend ([phone]), he found her, due to falling and notice she wasn't "acting right." Patient told him she had taking an overdose of medication. He attempted to make her "throw up" but wasn't successful. That is when he brought her to the ER. Boyfriend further explains, he have no knowledge of past suicidal attempts, gestures or ideations. He also reports she's currently receives outpatient treatment with [facility].   Diagnosis: Depression  Past Medical History:  Past Medical History:  Diagnosis Date  . Anxiety   . Depression     History reviewed. No pertinent surgical history.  Family History: No family history on file.  Social History:  reports that she drinks about 0.6 oz of alcohol per week. She reports that she uses drugs. Drug: Marijuana. Her tobacco history is not on file.  Additional Social History:  Alcohol / Drug Use Pain Medications: See PTA Prescriptions: See PTA Over the Counter: See PTA History of alcohol / drug use?: No history of alcohol / drug abuse Longest period of sobriety (when/how long): Unable to quantify  CIWA: CIWA-Ar BP: 101/62 Pulse Rate: 81 COWS:    Allergies: No Known Allergies  Home Medications:  (Not in a hospital admission)  OB/GYN Status:  No LMP recorded (lmp unknown).  General Assessment Data Location of Assessment: [facility] ED TTS Assessment: In  system Is this a Tele or Face-to-Face Assessment?: Face-to-Face Is this an Initial Assessment or a Re-assessment for this encounter?: Initial Assessment Marital status: Single Maiden name: n/a Is patient pregnant?: No Pregnancy Status: No Living Arrangements: Spouse/significant other Can pt return to current living arrangement?: Yes Admission Status: Involuntary Is patient capable of signing voluntary admission?: No Referral Source: Self/Family/Friend Insurance type: Medicaid  Medical Screening Exam [facility] Walk-in ONLY) Medical Exam completed: Yes  Crisis Care Plan Living Arrangements: Spouse/significant other Legal Guardian: Other:(Self) Name of Psychiatrist: [facility] Name of Therapist: [facility]  Education Status Is patient currently in school?: No Current Grade: College 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 : Yes Suicidal Intent: Yes-Currently Present Has patient had any suicidal intent within the past 6 months prior to admission? : Yes Is patient at risk for suicide?: Yes Suicidal Plan?: Yes-Currently Present Has patient had any suicidal plan within the past 6 months prior to admission? : Yes Specify Current Suicidal Plan: Overdose on medications Access to Means: Yes Specify Access to Suicidal Means: Medications What has been your use of drugs/alcohol within the last 12 months?: Cannabis Previous Attempts/Gestures: No How many times?: 1 Other Self Harm Risks: None reported Triggers for Past Attempts: None known Intentional Self Injurious Behavior: None Family Suicide History: Yes Recent stressful life event(s): Loss (Comment), Other (  Comment) Persecutory voices/beliefs?: No Depression: Yes Depression Symptoms: Feeling angry/irritable Substance  abuse history and/or treatment for substance abuse?: Yes Suicide prevention information given to non-admitted patients: Not applicable  Risk to Others within the past 6 months Homicidal Ideation: No Does patient have any lifetime risk of violence toward others beyond the six months prior to admission? : No Thoughts of Harm to Others: No Current Homicidal Intent: No Current Homicidal Plan: No Access to Homicidal Means: No Identified Victim: None reported History of harm to others?: No Assessment of Violence: None Noted Violent Behavior Description: None reported Does patient have access to weapons?: No Criminal Charges Pending?: No Does patient have a court date: No Is patient on probation?: Unknown  Psychosis Hallucinations: None noted Delusions: None noted  Mental Status Report Appearance/Hygiene: Unable to Assess Eye Contact: Unable to Assess Motor Activity: Unable to assess Speech: Unable to assess Level of Consciousness: Unable to assess Mood: Depressed Affect: Appropriate to circumstance, Depressed Anxiety Level: Minimal Thought Processes: Unable to Assess Judgement: Unable to Assess Orientation: Unable to assess Obsessive Compulsive Thoughts/Behaviors: Unable to Assess  Cognitive Functioning Concentration: Unable to Assess Memory: Unable to Assess Is patient IDD: No Is patient DD?: No Insight: Unable to Assess Impulse Control: Unable to Assess Appetite: (UTA) Have you had any weight changes? : No Change Sleep: Unable to Assess Total Hours of Sleep: 8 Vegetative Symptoms: None  ADLScreening [facility] Assessment Services) Patient's cognitive ability adequate to safely complete daily activities?: Yes Patient able to express need for assistance with ADLs?: Yes Independently performs ADLs?: Yes (appropriate for developmental age)  Prior Inpatient Therapy Prior Inpatient Therapy: No Prior Therapy Dates: n/a Prior Therapy Facilty/Provider(s): n/a Reason for  Treatment: n/a  Prior Outpatient Therapy Prior Outpatient Therapy: Yes Prior Therapy Dates: current Prior Therapy Facilty/Provider(s): [facility] Reason for Treatment: depression Does patient have an ACCT team?: No Does patient have Intensive In-House Services?  : No Does patient have [facility] services? : No Does patient have P4CC services?: No  ADL Screening (condition at time of admission) Patient's cognitive ability adequate to safely complete daily activities?: Yes Is the patient deaf or have difficulty hearing?: No Does the patient have difficulty seeing, even when wearing glasses/contacts?: No Does the patient have difficulty concentrating, remembering, or making decisions?: No Patient able to express need for assistance with ADLs?: Yes Does the patient have difficulty dressing or bathing?: No Independently performs ADLs?: Yes (appropriate for developmental age) Does the patient have difficulty walking or climbing stairs?: No Weakness of Legs: None Weakness of Arms/Hands: None  Home Assistive Devices/Equipment Home Assistive Devices/Equipment: None  Therapy Consults (therapy consults require a physician order) PT Evaluation Needed: No OT Evalulation Needed: No SLP Evaluation Needed: No Abuse/Neglect Assessment (Assessment to be complete while patient is alone) Abuse/Neglect Assessment Can Be Completed: Yes Physical Abuse: Denies Verbal Abuse: Denies Sexual Abuse: Denies Exploitation of patient/patient's resources: Denies Self-Neglect: Denies Values / Beliefs Cultural Requests During Hospitalization: None Spiritual Requests During Hospitalization: None Consults Spiritual Care Consult Needed: No Social Work Consult Needed: No Merchant navy officer (For Healthcare) Does Patient Have a Medical Advance Directive?: No    Additional Information 1:1 In Past 12 Months?: No CIRT Risk: No Elopement Risk: No Does patient have medical clearance?: Yes  Child/Adolescent  Assessment Running Away Risk: Denies(Patient is an adult)  Disposition:  Disposition Initial Assessment Completed for this Encounter: Yes Disposition of Patient: Admit(per Dr. Toni Amend)  On Site Evaluation by:   Reviewed with Physician:    Lilyan Gilford MS,  LCAS, LPC, NCC, CCSI Therapeutic Triage Specialist 08/14/2017 3:25 PM

## 2017-08-14 NOTE — ED Provider Notes (Signed)
Clinical Course as of Aug 15 1307  Wed Aug 14, 2017  0818 CT head and neck unremarkable. Pt remains hemodynamically stable, >8 hours after ingestion. Medically clear at this point to proceed with psych eval and treatment.   [PS]  1121 Pt became agitated and threw herself on the floor despite two nurses trying to redirect patient and maintain safety. Will given IV ativan to calm patient. Recruitment consultantafety sitter.    On exam, she does have a small 5mm laceration to the tip of the nose and a small bite wound to the buccal mucosa.  The patient is not cooperative and will be unable to allow safe laceration repair. This will be allowed to heal on its own. No evidence of dental injury, neck injury, or facial fracture.  [PS]    Clinical Course User Index [PS] Sharman CheekStafford, Neveen Daponte, MD     ----------------------------------------- 1:09 PM on 08/14/2017 -----------------------------------------  Discussed with Dr. Toni Amendlapacs. Patient currently not able to undergo psychiatric evaluation. We'll continue to observe in the ED until she can be appropriately evaluated for recommendations and disposition.   Sharman CheekStafford, Ceylon Arenson, MD 08/14/17 1310

## 2017-08-14 NOTE — ED Notes (Signed)
Pt taken to and from CT by sitter, CT staff and [employer] officer Jerilynn Somavilla

## 2017-08-14 NOTE — ED Provider Notes (Signed)
[facility] Emergency Department Provider Note   ____________________________________________   First MD Initiated Contact with Patient 08/14/17 [phone]     (approximate)  I have reviewed the triage vital signs and the nursing notes.   HISTORY  Chief Complaint Drug Overdose    HPI Stacey Jensen is a 22 y.o. female who comes into the hospital today after an overdose.  The patient took some clonidine and Lamictal.  The patient had 2 bottles of medication filled yesterday on March 5.  She had 30 clonidine 0.2 mg and 90 Lamictal 25 mg.  Both bottles are currently empty.  The patient states that she took a few but does not want to tell us how much she took.  The patient states that she was upset with her boyfriend because he is mean to her but she states that she was not trying to kill herself.  The patient's boyfriend said that he was with her but she did not drink any alcohol.  He states that she took the medication somewhere between 10 PM and midnight and went to sleep.  When she woke up she was stumbling around which prompted them to bring her to the hospital.  The patient is here for evaluation.  Past Medical History:  Diagnosis Date  . Anxiety   . Depression     Patient Active Problem List   Diagnosis Date Noted  . PTSD (post-traumatic stress disorder) 07/25/2017  . Moderate recurrent major depression (HCC) 07/25/2017  . Borderline personality disorder (HCC) 07/25/2017  . Cyclic vomiting syndrome 07/25/2017  . Cannabis abuse 07/25/2017    History reviewed. No pertinent surgical history.  Prior to Admission medications   Medication Sig Start Date End Date Taking? Authorizing Provider  cloNIDine (CATAPRES) 0.2 MG tablet Take 0.2 mg by mouth at bedtime. 05/30/17   [provider]  lamoTRIgine (LAMICTAL) 25 MG tablet Take 75 mg by mouth daily. 05/30/17   [provider]  MILI 0.25-35 MG-MCG tablet Take 1 tablet by mouth as directed.  05/27/17   [provider]  prazosin (MINIPRESS) 1 MG capsule Take 1 mg by mouth 2 (two) times daily.  05/08/17   [provider]    Allergies Patient has no known allergies.  No family history on file.  Social History Social History   Tobacco Use  . Smoking status: Unknown If Ever Smoked  Substance Use Topics  . Alcohol use: Yes    Alcohol/week: 0.6 oz    Types: 1 Standard drinks or equivalent per week  . Drug use: Yes    Types: Marijuana    Review of Systems  Constitutional: No fever/chills Eyes: No visual changes. ENT: No sore throat. Cardiovascular: Denies chest pain. Respiratory: Denies shortness of breath. Gastrointestinal: No abdominal pain.  No nausea, no vomiting.  No diarrhea.  No constipation. Genitourinary: Negative for dysuria. Musculoskeletal: Negative for back pain. Skin: Abrasion to top of head Neurological: Somnolence and confusion Psych: Overdose  ____________________________________________   PHYSICAL EXAM:  VITAL SIGNS: ED Triage Vitals  Enc Vitals Group     BP 08/14/17 0545 (!) 141/96     Pulse Rate 08/14/17 0545 73     Resp 08/14/17 0545 18     Temp 08/14/17 0557 (!) 97.5 F (36.4 C)     Temp Source 08/14/17 0557 Oral     SpO2 08/14/17 0545 99 %     Weight 08/14/17 0600 148 lb (67.1 kg)     Height 08/14/17 0600 5'  9" (1.753 m)     Head Circumference --      Peak Flow --      Pain Score --      Pain Loc --      Pain Edu? --      Excl. in GC? --     Constitutional: Patient somnolent but arousable, unable to really answer questions to determine orientation.  The patient is not in any significant distress at this time Eyes: Conjunctivae are normal. PERRL. EOMI. Head: Atraumatic. Nose: No congestion/rhinnorhea. Mouth/Throat: Mucous membranes are moist.  Oropharynx non-erythematous. Neck: No cervical spine tenderness to palpation. Cardiovascular: Normal rate, regular rhythm. Grossly normal heart sounds.  Good  peripheral circulation. Respiratory: Normal respiratory effort.  No retractions. Lungs CTAB. Gastrointestinal: Soft and nontender. No distention.  Positive bowel sounds Musculoskeletal: No lower extremity tenderness nor edema.   Neurologic: Slurred speech, slow to respond, patient not fully following commands but will attempt to answer some questions. Skin:  Skin is warm, dry, abrasion noted to top of head.  Psychiatric: Very somnolent but arousable  ____________________________________________   LABS (all labs ordered are listed, but only abnormal results are displayed)  Labs Reviewed  CBC - Abnormal; Notable for the following components:      Result Value   WBC 16.9 (*)    RDW 15.3 (*)    All other components within normal limits  COMPREHENSIVE METABOLIC PANEL - Abnormal; Notable for the following components:   Glucose, Bld 167 (*)    All other components within normal limits  MAGNESIUM - Abnormal; Notable for the following components:   Magnesium 1.6 (*)    All other components within normal limits  ACETAMINOPHEN LEVEL - Abnormal; Notable for the following components:   Acetaminophen (Tylenol), Serum <10 (*)    All other components within normal limits  PHOSPHORUS  SALICYLATE LEVEL  ETHANOL  URINE DRUG SCREEN, QUALITATIVE ([facility] ONLY)  POC URINE PREG, ED   ____________________________________________  EKG  ED ECG REPORT I, Rebecka Apley, the attending physician, personally viewed and interpreted this ECG.   Date: 08/14/2017  EKG Time: 545  Rate: 69  Rhythm: normal sinus rhythm  Axis: normal  Intervals:none  ST&T Change: none  ____________________________________________  RADIOLOGY  ED MD interpretation:  CT head and cervical spine  Official radiology report(s): No results found.  ____________________________________________   PROCEDURES  Procedure(s) performed: None  Procedures  Critical Care performed:  No  ____________________________________________   INITIAL IMPRESSION / ASSESSMENT AND PLAN / ED COURSE  As part of my medical decision making, I reviewed the following data within the electronic MEDICAL RECORD NUMBER Notes from prior ED visits and [place] Controlled Substance Database  This is a 22 year old female who comes into the hospital today status post an overdose of clonidine and Lamictal.  The patient had full bottles of medication and they are both now empty.  My concern is this may have been a suicide attempt.  We did check some blood work to include a CBC, CMP, magnesium, phosphorus, acetaminophen, salicylate, ethanol, urine drug screen and a CT head.  The patient's blood work is unremarkable aside from a magnesium level of 1.6.  I will give the patient 2 g of magnesium sulfate.  I did contact poison control and their recommendation was to monitor the patient for seizures due to the Lamictal as well as for cardiovascular instability due to the clonidine.  I will give the patient a liter of normal saline and we will monitor  the patient for 8 hours as recommended.  The patient will be involuntarily committed given her self-injurious behavior.      ____________________________________________   FINAL CLINICAL IMPRESSION(S) / ED DIAGNOSES  Final diagnoses:  Drug overdose, undetermined intent, initial encounter  Hypomagnesemia     ED Discharge Orders    None       Note:  This document was prepared using Dragon voice recognition software and may include unintentional dictation errors.    Rebecka ApleyWebster, Allison P, MD 08/14/17 [phone]

## 2017-08-14 NOTE — ED Notes (Signed)
Psychiatrist at bedside

## 2017-08-14 NOTE — ED Notes (Addendum)
Pt yelling and cursing. Verbally aggressive with ER staff and EDP.

## 2017-08-14 NOTE — ED Notes (Signed)
Pt crying, calling out for her boyfriend. Reoriented and given emotional support.

## 2017-08-14 NOTE — ED Notes (Signed)
Helped nurse Helmut Musterlicia to take patient back to bed.

## 2017-08-14 NOTE — ED Provider Notes (Addendum)
-----------------------------------------   5:44 PM on 08/14/2017 -----------------------------------------  Signed out to me at 3:30.  She has been resting calmly now she is awake and belligerent screaming at us using profanities, demanding to leave.  She is agitated and upset and demanding something for her anxiety.  We will give her some medication to ensure that she does not harm herself or others.   ----------------------------------------- 7:33 PM on 08/14/2017 -----------------------------------------  Patient much calmer, she is resting comfortably at this time.  Seen and evaluated by psychiatry feel that obviously she is not safe to go home.  Patient did apparently as was reported to me before my shift throw herself onto the floor.  Patient has some abrasions to her face.  She has no evidence of septal hematoma.  Now that she is more calm, I did a CT scan of her maxillofacial area to ensure that there is no significant trauma requiring attention, as the exam is somewhat difficult given her noncompliance.  CT scan of the face shows that there is a small nasal bone fracture which likely will not require any intervention.   Jeanmarie PlantMcShane, Marg Macmaster A, MD 08/14/17 1745    Jeanmarie PlantMcShane, Lew Prout A, MD 08/14/17 Barry [email or web address]

## 2017-08-14 NOTE — ED Notes (Signed)
Report to Sam, RN  

## 2017-08-14 NOTE — ED Notes (Signed)
Placed on cardiac monitor. Pt verbally aggressive, grabbing at staff's arms.

## 2017-08-14 NOTE — ED Notes (Signed)
Pt resting comfortably at this time. Respirations even and unlabored.  

## 2017-08-14 NOTE — ED Notes (Signed)
Patient used bed side commode with 2 assistance.Back to bed.Sitter at bedside.

## 2017-08-14 NOTE — ED Notes (Signed)
IVC/ Consult pending/ TTS completed/ Plan to Admit

## 2017-08-14 NOTE — ED Notes (Signed)
Patient gone for ct

## 2017-08-14 NOTE — ED Notes (Signed)
Pt calling out for boyfriend, reoriented. Pt back to sleep. No attempts to get out of bed.

## 2017-08-14 NOTE — ED Notes (Signed)
Into room with Sam, RN. Pt has pulled out IV, bleeding from left arm. Pt cleaned, bleeding controlled.

## 2017-08-14 NOTE — ED Notes (Signed)
Pt cardiac monitor placed back on her. Pt does not want men assisting her. Given patient privacy while placing cardiac monitor stickers on. Offered food, denied. Offered drink, denied.

## 2017-08-14 NOTE — ED Triage Notes (Signed)
Pt to the ER for drug overdose

## 2017-08-14 NOTE — ED Notes (Signed)
Poison control called back to check on status of patient.  Information on patient relayed to poison control.

## 2017-08-14 NOTE — ED Notes (Signed)
Poison control called for update on patient.

## 2017-08-14 NOTE — ED Notes (Signed)
Pt unable to stand up independently.

## 2017-08-14 NOTE — ED Notes (Signed)
Patient is resting comfortably. 

## 2017-08-14 NOTE — ED Notes (Signed)
Patient's boyfriend called and wants patient to call him when she has opportunity.

## 2017-08-14 NOTE — Consult Note (Signed)
[facility] Consult   Reason for Consult: Consult for 22 year old woman with a history of mood symptoms who was brought to the emergency room after an overdose Referring Physician: McShane Patient Identification: Stacey Jensen MRN:  [id number] Principal Diagnosis: Moderate recurrent major depression ([place]) Diagnosis:   Patient Active Problem List   Diagnosis Date Noted  . Overdose [T50.901A] 08/14/2017  . PTSD (post-traumatic stress disorder) [F43.10] 07/25/2017  . Moderate recurrent major depression ([place]) [F33.1] 07/25/2017  . Borderline personality disorder ([place]) [F60.3] 07/25/2017  . Cyclic vomiting syndrome [G43.A0] 07/25/2017  . Cannabis abuse [F12.10] 07/25/2017    Total Time spent with patient: 1 hour  Subjective:   Stacey Jensen is a 22 y.o. female patient admitted with "I was not suicidal".  HPI: Patient interviewed chart reviewed.  This 22 year old woman came into the emergency room very early this morning after family found her appearing intoxicated and staggering.  She admitted that she had taken an overdose of lamotrigine and clonidine.  In the emergency room patient was disorganized uncooperative and intermittently belligerent.  Had to be sedated for safety.  Was not able to communicate with me this morning.  This afternoon I finally found her in a state where she was able to give some information.  Patient says her mood has continued to be a combination of depressed anxious angry and upset.  She talks about how her boyfriend hurt her but she still loves him.  Does not go into much detail about it.  Tells me that she took a large amount of lamotrigine and clonidine yesterday.  She claims that she picked those medicines because she did not think that you could kill yourself with them and that she just wanted to "get messed up".  Patient's affect is irritable and her thoughts remain disorganized.  Apparently she just recently got restarted on medicine when she went to  [employer] and saw a physician.  She admits that she continues to smoke marijuana regularly denies that she has been drinking.  Social history: Lives with her family.  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  Has a chaotic home life from everything that she describes between her sister and her boyfriend.  Medical history: Has claimed in the past to have "cyclic vomiting syndrome".  Currently she has bruises and contusions on her face from where she threw herself against the floor earlier this morning.  Substance abuse history: Patient says that she used to drink but is not drinking much anymore.  She says that she continues to use marijuana regularly which she justifies as being some kind of treatment for her a  cyclic vomiting.  Denies that she abuses any drugs.  Past Psychiatric History: Patient was seen one time previously in the emergency room just about a month ago.  At that time she had also taken an impulsive bunch of pills but was claiming to not be suicidal.  We did not keep her in the hospital at that time.  She was more alert and appropriate in her interactions.  She does have a long history of mood disorder with what sounds like a borderline kind of pattern to it with chronic agitation and mood instability.  Claims that she has never actually tried to kill herself in the past.  Does not appear to have had inpatient treatment.    Risk to Self: Suicidal Ideation: Yes-Currently Present Suicidal Intent: Yes-Currently Present Is patient  at risk for suicide?: Yes Suicidal Plan?: Yes-Currently Present Specify Current Suicidal Plan: Overdose on medications Access to Means: Yes Specify Access to Suicidal Means: Medications What has been your use of drugs/alcohol within the last 12 months?: Cannabis How many times?: 1 Other Self Harm Risks: None reported Triggers for Past Attempts: None known Intentional Self Injurious  Behavior: None Risk to Others: Homicidal Ideation: No Thoughts of Harm to Others: No Current Homicidal Intent: No Current Homicidal Plan: No Access to Homicidal Means: No Identified Victim: None reported History of harm to others?: No Assessment of Violence: None Noted Violent Behavior Description: None reported Does patient have access to weapons?: No Criminal Charges Pending?: No Does patient have a court date: No Prior Inpatient Therapy: Prior Inpatient Therapy: No Prior Therapy Dates: n/a Prior Therapy Facilty/Provider(s): n/a Reason for Treatment: n/a Prior Outpatient Therapy: Prior Outpatient Therapy: Yes Prior Therapy Dates: current Prior Therapy Facilty/Provider(s): [facility] Reason for Treatment: depression Does patient have an ACCT team?: No Does patient have Intensive In-House Services?  : No Does patient have [facility] services? : No Does patient have P4CC services?: No  Past Medical History:  Past Medical History:  Diagnosis Date  . Anxiety   . Depression    History reviewed. No pertinent surgical history. Family History: No family history on file. Family Psychiatric  History: Her father took his own life several years ago Social History:  Social History   Substance and Sexual Activity  Alcohol Use Yes  . Alcohol/week: 0.6 oz  . Types: 1 Standard drinks or equivalent per week     Social History   Substance and Sexual Activity  Drug Use Yes  . Types: Marijuana    Social History   Socioeconomic History  . Marital status: Single    Spouse name: None  . Number of children: None  . Years of education: None  . Highest education level: None  Social Needs  . Financial resource strain: None  . Food insecurity - worry: None  . Food insecurity - inability: None  . Transportation needs - medical: None  . Transportation needs - non-medical: None  Occupational History  . None  Tobacco Use  . Smoking status: Unknown If Ever Smoked  Substance and Sexual Activity   . Alcohol use: Yes    Alcohol/week: 0.6 oz    Types: 1 Standard drinks or equivalent per week  . Drug use: Yes    Types: Marijuana  . Sexual activity: Yes    Birth control/protection: None  Other Topics Concern  . None  Social History Narrative  . None   Additional Social History:    Allergies:  No Known Allergies  Labs:  Results for orders placed or performed during the hospital encounter of 08/14/17 (from the past 48 hour(s))  CBC     Status: Abnormal   Collection Time: 08/14/17  5:44 AM  Result Value Ref Range   WBC 16.9 (H) 3.6 - 11.0 K/uL   RBC 4.80 3.80 - 5.20 MIL/uL   Hemoglobin 12.8 12.0 - 16.0 g/dL   HCT 40.0 35.0 - 47.0 %   MCV 83.4 80.0 - 100.0 fL   MCH 26.7 26.0 - 34.0 pg   MCHC 32.0 32.0 - 36.0 g/dL   RDW 15.3 (H) 11.5 - 14.5 %   Platelets 288 150 - 440 K/uL    Comment: Performed at [facility], [address], [place], [place] [address]  Comprehensive metabolic panel     Status: Abnormal   Collection  Time: 08/14/17  5:44 AM  Result Value Ref Range   Sodium 137 135 - 145 mmol/L   Potassium 3.5 3.5 - 5.1 mmol/L   Chloride 106 101 - 111 mmol/L   CO2 26 22 - 32 mmol/L   Glucose, Bld 167 (H) 65 - 99 mg/dL   BUN 10 6 - 20 mg/dL   Creatinine, Ser 0.63 0.44 - 1.00 mg/dL   Calcium 9.3 8.9 - 10.3 mg/dL   Total Protein 7.7 6.5 - 8.1 g/dL   Albumin 4.0 3.5 - 5.0 g/dL   AST 28 15 - 41 U/L   ALT 25 14 - 54 U/L   Alkaline Phosphatase 65 38 - 126 U/L   Total Bilirubin 1.2 0.3 - 1.2 mg/dL   GFR calc non Af Amer >60 >60 mL/min   GFR calc Af Amer >60 >60 mL/min    Comment: (NOTE) The eGFR has been calculated using the CKD EPI equation. This calculation has not been validated in all clinical situations. eGFR's persistently <60 mL/min signify possible Chronic Kidney Disease.    Anion gap 5 5 - 15    Comment: Performed at [facility], [address] [place], [place] [address]  Magnesium     Status: Abnormal   Collection Time: 08/14/17  5:44  AM  Result Value Ref Range   Magnesium 1.6 (L) 1.7 - 2.4 mg/dL    Comment: Performed at [facility], [address] [place], [place] [address]  Phosphorus     Status: None   Collection Time: 08/14/17  5:44 AM  Result Value Ref Range   Phosphorus 3.4 2.5 - 4.6 mg/dL    Comment: Performed at [facility], [address] [place], [place] [address]  Acetaminophen level     Status: Abnormal   Collection Time: 08/14/17  5:44 AM  Result Value Ref Range   Acetaminophen (Tylenol), Serum <10 (L) 10 - 30 ug/mL    Comment:        THERAPEUTIC CONCENTRATIONS VARY SIGNIFICANTLY. A RANGE OF 10-30 ug/mL MAY BE AN EFFECTIVE CONCENTRATION FOR MANY PATIENTS. HOWEVER, SOME ARE BEST TREATED AT CONCENTRATIONS OUTSIDE THIS RANGE. ACETAMINOPHEN CONCENTRATIONS >150 ug/mL AT 4 HOURS AFTER INGESTION AND >50 ug/mL AT 12 HOURS AFTER INGESTION ARE OFTEN ASSOCIATED WITH TOXIC REACTIONS. Performed at [facility], [address] [place], [place] [address]   Salicylate level     Status: None   Collection Time: 08/14/17  5:44 AM  Result Value Ref Range   Salicylate Lvl <0.3 2.8 - 30.0 mg/dL    Comment: Performed at [facility], [address] [place], [place] [address]  Ethanol     Status: None   Collection Time: 08/14/17  5:44 AM  Result Value Ref Range   Alcohol, Ethyl (B) <10 <10 mg/dL    Comment:        LOWEST DETECTABLE LIMIT FOR SERUM ALCOHOL IS 10 mg/dL FOR MEDICAL PURPOSES ONLY Performed at [facility], [address], [place],  [address]   Urine Drug Screen, Qualitative ([place] only)     Status: Abnormal   Collection Time: 08/14/17  5:48 AM  Result Value Ref Range   Tricyclic, Ur Screen NONE DETECTED NONE DETECTED   Amphetamines, Ur Screen NONE DETECTED NONE DETECTED   MDMA (Ecstasy)Ur Screen NONE DETECTED NONE DETECTED   Cocaine Metabolite,Ur [place] NONE DETECTED NONE DETECTED   Opiate, Ur Screen NONE DETECTED NONE DETECTED    Phencyclidine (PCP) Ur S NONE DETECTED NONE DETECTED  Cannabinoid 50 Ng, Ur [place] POSITIVE (A) NONE DETECTED   Barbiturates, Ur Screen NONE DETECTED NONE DETECTED   Benzodiazepine, Ur Scrn NONE DETECTED NONE DETECTED   Methadone Scn, Ur NONE DETECTED NONE DETECTED    Comment: (NOTE) Tricyclics + metabolites, urine    Cutoff 1000 ng/mL Amphetamines + metabolites, urine  Cutoff 1000 ng/mL MDMA (Ecstasy), urine              Cutoff 500 ng/mL Cocaine Metabolite, urine          Cutoff 300 ng/mL Opiate + metabolites, urine        Cutoff 300 ng/mL Phencyclidine (PCP), urine         Cutoff 25 ng/mL Cannabinoid, urine                 Cutoff 50 ng/mL Barbiturates + metabolites, urine  Cutoff 200 ng/mL Benzodiazepine, urine              Cutoff 200 ng/mL Methadone, urine                   Cutoff 300 ng/mL The urine drug screen provides only a preliminary, unconfirmed analytical test result and should not be used for non-medical purposes. Clinical consideration and professional judgment should be applied to any positive drug screen result due to possible interfering substances. A more specific alternate chemical method must be used in order to obtain a confirmed analytical result. Gas chromatography / mass spectrometry (GC/MS) is the preferred confirmat ory method. Performed at [facility], [address] [place], [place] [address]     No current facility-administered medications for this encounter.    Current Outpatient Medications  Medication Sig Dispense Refill  . cloNIDine (CATAPRES) 0.2 MG tablet Take 0.2 mg by mouth at bedtime.  0  . lamoTRIgine (LAMICTAL) 25 MG tablet Take 75 mg by mouth daily.  0  . MILI 0.25-35 MG-MCG tablet Take 1 tablet by mouth daily.   1  . prazosin (MINIPRESS) 1 MG capsule Take 1 mg by mouth 2 (two) times daily.   0    Musculoskeletal: Strength & Muscle Tone: decreased Gait & Station: unable to stand Patient leans: N/A  Psychiatric Specialty  Exam: Physical Exam  Nursing note and vitals reviewed. Constitutional: She appears well-developed and well-nourished.  HENT:  Head: Normocephalic and atraumatic.    Eyes: Conjunctivae are normal. Pupils are equal, round, and reactive to light.  Neck: Normal range of motion.  Cardiovascular: Normal heart sounds.  Respiratory: Effort normal.  GI: Soft.  Musculoskeletal: Normal range of motion.  Neurological: She is alert.  Skin: Skin is warm and dry.  Psychiatric: Her affect is blunt. Her speech is slurred. She is slowed. Cognition and memory are impaired. She expresses impulsivity. She expresses no homicidal and no suicidal ideation.    Review of Systems  Constitutional: Negative.   HENT: Negative.   Eyes: Negative.   Respiratory: Negative.   Cardiovascular: Negative.   Gastrointestinal: Negative.   Musculoskeletal: Negative.   Skin: Negative.   Neurological: Negative.   Psychiatric/Behavioral: Positive for depression, memory loss and substance abuse. Negative for hallucinations and suicidal ideas. The patient is nervous/anxious and has insomnia.     Blood pressure 101/62, pulse 89, temperature (!) 97.5 F (36.4 C), temperature source Oral, resp. rate 14, height _0  (1.753 m), weight 67.1 kg (148 lb), SpO2 100 %.Body mass index is 21.86 kg/m.  General Appearance: Disheveled  Eye Contact:  Minimal  Speech:  Garbled and  Slurred  Volume:  Decreased  Mood:  Dysphoric  Affect:  Inappropriate and Labile  Thought Process:  Disorganized  Orientation:  Full (Time, Place, and Person)  Thought Content:  Rumination  Suicidal Thoughts:  No  Homicidal Thoughts:  No  Memory:  Immediate;   Fair Recent;   Poor Remote;   Poor  Judgement:  Impaired  Insight:  Shallow  Psychomotor Activity:  Decreased  Concentration:  Concentration: Poor  Recall:  [employer] of Knowledge:  Fair  Language:  Fair  Akathisia:  No  Handed:  Right  AIMS (if indicated):     Assets:   Housing Resilience  ADL's:  Impaired  Cognition:  Impaired,  Mild  Sleep:        Treatment Plan Summary: Daily contact with patient to assess and evaluate symptoms and progress in treatment, Medication management and Plan 22 year old woman with a history of mood instability.  Took an impulsive overdose of a very large amount of medications yesterday although she continues to insist that she is not suicidal.  Her insight is poor.  Every time she has awoken today she has gotten so agitated that she has required further sedation.  Patient keeps repeating that she is going to leave the emergency room.  Her insight as I said is quite poor.  Certainly in note shape to be discharged.  Patient will be appended for admission to the psychiatric ward.  Labs reviewed.  Orders done.  We have no beds at the moment but as soon as one is available or if we can find a place to refer her we can admit her.  Case reviewed with ER physician and TTS.  Disposition: Recommend psychiatric Inpatient admission when medically cleared. Supportive therapy provided about ongoing stressors.  Alethia Berthold, MD 08/14/2017 6:34 PM

## 2017-08-14 NOTE — ED Notes (Signed)
Pt moved into room 22 of the Quad, Sitter at bedside

## 2017-08-14 NOTE — ED Notes (Signed)
Pt. Returning from ct when this nurse started shift.  Pt. Has an one on one sitter.  Pt. assisted from stretcher to mattress on floor.  Pt. Remained calm and cooperative

## 2017-08-14 NOTE — ED Notes (Addendum)
Bed alarm and high fall risk socks continue to stay on patient.

## 2017-08-14 NOTE — ED Notes (Signed)
Pt brought to the ER for AMS and irregular gait r/t a intentional drug overdose. Pt states she was not trying to kill herself but took the pills because "I can't forget" and "my boyfriend was mean to me". I asked if she was still with her boyfriend and pt states "Yes I love him". Pt boyfriend Stacey Jensen is the person who brought pt to the hospital. He states pt got the pills filled yesterday and the bottles were full. He attempted to keep the pills away from pt last night but she found them. Stacey Jensen states they brought her to the ER because when she woke up she was stumbling. Pt took 90 Lamotrigine 25mg , and 30 Clonidine .2mg  last night sometime between 2200 and 0000. Pt fell at some point and has a small abrasion to the top of her head with minimal bleeding. Pt states she has overdosed in the past and her father committed suicide. Pt denies this was an attempt to kill herself. Pt is very defensive when staff are undressing her. Pt speech is difficult to understand and pt is unable to answer questions. Telephone numbers Stacey Jensen [phone], Stacey Jensen [phone] boyfriend's brother.

## 2017-08-14 NOTE — ED Notes (Signed)
Pt changed in maroon clothing by this RN and ODS Metallurgistofficer Teague. Pt cooperative. Patient only had gown, no other belongings on her.

## 2017-08-14 NOTE — ED Notes (Addendum)
Poison control called for another update. Informed to place patient on cardiac monitor.

## 2017-08-14 NOTE — ED Notes (Signed)
Pt yelling out, asking for boyfriend. Reoriented. Pt requesting IV be removed. Will ask EDP if any more medical tests or interventions need to be performed prior to.

## 2017-08-15 ENCOUNTER — Emergency Department: Payer: Medicaid Other

## 2017-08-15 LAB — CBC WITH DIFFERENTIAL/PLATELET
BASOS ABS: 0 10*3/uL (ref 0–0.1)
Basophils Relative: 0 %
EOS PCT: 0 %
Eosinophils Absolute: 0 10*3/uL (ref 0–0.7)
HCT: 39.3 % (ref 35.0–47.0)
HEMOGLOBIN: 12.7 g/dL (ref 12.0–16.0)
LYMPHS ABS: 2 10*3/uL (ref 1.0–3.6)
LYMPHS PCT: 16 %
MCH: 26.9 pg (ref 26.0–34.0)
MCHC: 32.3 g/dL (ref 32.0–36.0)
MCV: 83.4 fL (ref 80.0–100.0)
Monocytes Absolute: 0.9 10*3/uL (ref 0.2–0.9)
Monocytes Relative: 7 %
Neutro Abs: 9.8 10*3/uL — ABNORMAL HIGH (ref 1.4–6.5)
Neutrophils Relative %: 77 %
PLATELETS: 227 10*3/uL (ref 150–440)
RBC: 4.71 MIL/uL (ref 3.80–5.20)
RDW: 15.4 % — AB (ref 11.5–14.5)
WBC: 12.8 10*3/uL — AB (ref 3.6–11.0)

## 2017-08-15 LAB — COMPREHENSIVE METABOLIC PANEL
ALBUMIN: 4.1 g/dL (ref 3.5–5.0)
ALT: 23 U/L (ref 14–54)
AST: 24 U/L (ref 15–41)
Alkaline Phosphatase: 73 U/L (ref 38–126)
Anion gap: 13 (ref 5–15)
BUN: 7 mg/dL (ref 6–20)
CO2: 20 mmol/L — AB (ref 22–32)
Calcium: 8.8 mg/dL — ABNORMAL LOW (ref 8.9–10.3)
Chloride: 104 mmol/L (ref 101–111)
Creatinine, Ser: 0.63 mg/dL (ref 0.44–1.00)
GFR calc Af Amer: >60 mL/min (ref >60)
GFR calc non Af Amer: >60 mL/min (ref >60)
GLUCOSE: 107 mg/dL — AB (ref 65–99)
POTASSIUM: 3.6 mmol/L (ref 3.5–5.1)
SODIUM: 137 mmol/L (ref 135–145)
Total Bilirubin: 1.8 mg/dL — ABNORMAL HIGH (ref 0.3–1.2)
Total Protein: 7.7 g/dL (ref 6.5–8.1)

## 2017-08-15 LAB — GLUCOSE, CAPILLARY: GLUCOSE-CAPILLARY: 100 mg/dL — AB (ref 65–99)

## 2017-08-15 MED ORDER — IBUPROFEN 800 MG PO TABS
800.0000 mg | ORAL_TABLET | Freq: Once | ORAL | Status: DC
  Filled 2017-08-15: qty 1

## 2017-08-15 MED ORDER — ONDANSETRON 4 MG PO TBDP
ORAL_TABLET | ORAL | Status: AC
  Administered 2017-08-15: 4 mg via ORAL
  Filled 2017-08-15: qty 1

## 2017-08-15 MED ORDER — HALOPERIDOL LACTATE 5 MG/ML IJ SOLN
10.0000 mg | Freq: Once | INTRAMUSCULAR | Status: AC
  Administered 2017-08-15: 10 mg via INTRAMUSCULAR
  Filled 2017-08-15: qty 2

## 2017-08-15 MED ORDER — ONDANSETRON 4 MG PO TBDP
4.0000 mg | ORAL_TABLET | Freq: Once | ORAL | Status: AC
Start: 2017-08-15 — End: 2017-08-15
  Administered 2017-08-15: 4 mg via ORAL
  Filled 2017-08-15: qty 1

## 2017-08-15 MED ORDER — ONDANSETRON 4 MG PO TBDP
4.0000 mg | ORAL_TABLET | Freq: Once | ORAL | Status: AC
  Administered 2017-08-15: 4 mg via ORAL
  Filled 2017-08-15: qty 1

## 2017-08-15 NOTE — ED Notes (Signed)
Pt. Asked if she could go home.  Pt. Advised she was Involuntary commented and she would be talking with a psychiatrist in the morning.  Pt. Asked if she could call boyfriend in the morning.  Pt. Told she could call in the during phone times the first being from 9 am to 11 am.  Pt. Satisfied with this answer.

## 2017-08-15 NOTE — ED Provider Notes (Addendum)
Patient is now having nystagmus with slurred speech and altered mental status of unclear etiology.  She was discussed with poison control, these 2 medications do not have toxic metabolites.  No clear explanation as to why she is acutely agitated.     Emily FilbertWilliams, Jonathan E, MD 08/15/17 1154    Emily FilbertWilliams, Jonathan E, MD 08/15/17 [phone]

## 2017-08-15 NOTE — ED Provider Notes (Signed)
Repeat EKG interpreted by me, sinus rhythm the rate 85 bpm, rightward axis, normal QRS, normal QT   Stacey Jensen, Silvestre Mines E, MD 08/15/17 1306

## 2017-08-15 NOTE — BH Assessment (Signed)
[facility](Kelly [phone]) and informed them the patient was not going to transport today due to recent medical concerns. Writer asked if the bed would still remain available tomorrow (08/16/2017) and was told no. Writer was advised to submitted updated information and they will review it.

## 2017-08-15 NOTE — ED Notes (Signed)
Pt. Stated nausea improved with medication.

## 2017-08-15 NOTE — ED Notes (Signed)

## 2017-08-15 NOTE — BH Assessment (Signed)
Referral information for Psychiatric Hospitalization faxed to;    Service Provider Address Phone Number Fax Number  [facility] [phone]  [place]  [phone]  [facility] [address]  [place] [address]  [phone]  [facility] [address],  [place] [address]  [phone]  [facility] [address]  [place] [address]  [phone]  [facility] [address]  [place] [address]  [phone]  [facility] [address]  [place] [address]  [phone]  [facility] [address],  [place] [address]  [phone]  [facility] [address], [place] [address]  [phone]  [facility] [address]  [place] [address]  [phone]  [facility] [address]  [place] [address]  [phone]  [facility] [address] [place] [address]  [phone]  [facility] [address]  [place] [address]  [phone]  [facility] [address],  [place] [address]  [phone]  [facility] [address]  [place] [address]  [phone]  [facility] [address],  [place] [address]  [phone]  [facility] [address]  [place] [address]  [phone]  [facility] [address]  [place] [address]  [phone]  [facility] [address] [place] [address]  [phone]  [facility] [address], [place] [address]  [phone]  [facility] [address] [place] [address]  [phone]  [facility] [address]  [place] [address]  [phone]  [facility] [address]  [place] [address]  [phone]  [facility] [address]  [place] [address]  [phone]  [facility] [address]  [place] [address]  [phone]  [facility] [address]  [place] [address]  [phone]  [facility] [address]  [place] [address] [phone]

## 2017-08-15 NOTE — ED Provider Notes (Signed)
-----------------------------------------   4:02 PM on 08/15/2017 -----------------------------------------  Stacey Jensen is here for transport to [facility].  However the patient remains very somnolent, requiring a two-person assist to ambulate.  I personally evaluated the patient, she is awake alert, able to answer questions but still seems confused.  Patient did have a fall yesterday in which she suffered a nasal fracture.  Patient states she feels very off balance while attempting to walk feels dizzy.  I will repeat a head CT as the patient hit her head hard enough to fracture her nose yesterday after her initial head CT was performed.  Patient recently required IM Haldol approximate 4 hours ago for agitation.  This could very likely be a result of the medication.  We will continue to monitor in the emergency department.  We have canceled the [facility] transfer at this time until the patient is more stable.  Repeat lab work performed today continues to appear well/reassuring.     Repeat CT scan is negative.  We will continue to monitor in the emergency department.       Minna AntisPaduchowski, Pedram Goodchild, MD 08/15/17 [phone]

## 2017-08-15 NOTE — BH Assessment (Signed)
Patient has been accepted to [facility].  Patient assigned to room Main  Accepting physician is Dr. Estill Cottahomas Cornwall.  Call report to [phone].  Representative was [employer].   ER Staff is aware of it:  Emilie, ER Sect.;  Dr. Mayford KnifeWilliams, ER MD  Aundra MilletMegan, Patient's Nurse     Patient can arrived anytime today.   Address[place] [address] [place], [place] [id number]

## 2017-08-15 NOTE — BH Assessment (Signed)
Pt denied to [facility] due to SA.

## 2017-08-15 NOTE — ED Notes (Signed)
Pt handed phone again to call fiance still no answer after 3 more attempts.

## 2017-08-15 NOTE — ED Notes (Signed)
This RN went to bedside due to patient yelling out. Lights turned on and patient assessed by this RN. Acute alteration mental status noted. Dr. Mayford KnifeWilliams called. Pt noted to have 4mm pupils, round, reactive to light, however nystagmus noted to both eyes, pt is also noted to be confused, appears to be responding to internal stimuli (repeatedly asking for hug and kiss, when this RN responds pt states, "not you!" pt will also state, "Treon get your ass in here!" to no one in particular). Pt also noted to have slurred speech. Pt is also noted to have ataxia to all extremities. Orders received for repeat blood work, end ED EKG. VS rechecked and stable at this time, CBG WNL. Medication administered per MD order. Discussed care with MD and will wait for medication to kick in prior to obtaining repeat EKG and blood work.

## 2017-08-15 NOTE — ED Notes (Signed)
Poison Control called to inquiry on patient.  Poison control given information, stated they would d/c case.

## 2017-08-15 NOTE — ED Notes (Signed)
Pt given phone for 10 minute phone call with her boyfriend.

## 2017-08-15 NOTE — ED Provider Notes (Signed)
-----------------------------------------   6:09 AM on 08/15/2017 -----------------------------------------   Blood pressure 101/62, pulse 89, temperature (!) 97.5 F (36.4 C), temperature source Oral, resp. rate 14, height 5\' 9"  (1.753 m), weight 67.1 kg (148 lb), SpO2 100 %.  The patient had Zofran ODT overnight for nausea.  She never vomited.  Nausea better. Calm and cooperative at this time.  Disposition is pending Psychiatry/Behavioral Medicine team recommendations.     Irean HongSung, Justun Anaya J, MD 08/15/17 [phone]

## 2017-08-15 NOTE — ED Notes (Signed)
Lunch tray placed at patient's bedside. Pt refused to eat at this time.

## 2017-08-15 NOTE — ED Notes (Signed)
Pt's dinner tray sat at bedside, pt refusing to eat. Will continue to monitor for further patient needs at this time. Pt continues to have 1:1 sitter.

## 2017-08-15 NOTE — ED Notes (Signed)
Pt given vomit bag per her request. MD notified of patient c/o HA and upset stomach, will await orders. Pt remains with 1:1 sitter at this time. Will continues to monitor for further patient needs.

## 2017-08-15 NOTE — ED Notes (Signed)
Breakfast tray placed at patient's bedside at this time. Pt states she is not hungry. Pt is noted to be calm and cooperative. Continues to have 1:1 sitter at bedside. NAD noted. Will continue to monitor for further patient needs.

## 2017-08-15 NOTE — Consult Note (Signed)
[place] Psychiatry Consult   Reason for Consult: Consult for 22 year old woman follow-up in the emergency room who came in yesterday after having taken a large overdose of prescription medicine Referring Physician: Jimmye Norman Patient Identification: Stacey Jensen MRN:  [id number] Principal Diagnosis: Moderate recurrent major depression ([place]) Diagnosis:   Patient Active Problem List   Diagnosis Date Noted  . Overdose [T50.901A] 08/14/2017  . PTSD (post-traumatic stress disorder) [F43.10] 07/25/2017  . Moderate recurrent major depression ([place]) [F33.1] 07/25/2017  . Borderline personality disorder ([place]) [F60.3] 07/25/2017  . Cyclic vomiting syndrome [G43.A0] 07/25/2017  . Cannabis abuse [F12.10] 07/25/2017    Total Time spent with patient: 15 minutes  Subjective:   Stacey Jensen is a 22 y.o. female patient admitted with "I need medicine".  HPI: Patient seen and evaluated today.  Chart reviewed.  Spoke with nursing.  See note from yesterday.  22 year old woman with a history of unstable mood disorder came into the hospital after taking a large overdose of lamotrigine and clonidine.  Despite the extremely large overdose she insisted throughout that she was not "suicidal" but her behavior has been agitated and disorganized and it has not been possible to have a rational conversation with her since her presentation.  Nursing tells me that as soon as she woke up this morning she became agitated and aggressive again.  She was given 10 mg of Haldol IM as ordered by the emergency room physician.  I tried to speak with her and found her awake but still uncooperative.  Her affect was angry and labile.  Her speech was confusing and skip from topic to topic.  She would talk about how she needed medicine "for my head" but would not answer questions as to whether she meant pain or some other symptom.  She would move on to other angry topics.  Review of labs shows that the initial EKG was essentially  unremarkable.  Her blood pressure has stayed stable to slightly high this morning.  I understand another EKG is planned.  Facial CT was finally completed and shows a small fracture of a paranasal bone.  There is no other fracture or finding on her head or neck CTs.  Other psychiatric medicines besides as needed have been held so far because of her overdose and it being a little unclear just which medicines would be most helpful.  Patient has now been accepted to [facility] for inpatient has our unit remains at capacity.  Past Psychiatric History: Patient has a history of diagnosis with either depression or bipolar disorder as well as PTSD and a history of impulsive behavior in the past.  Has not previously been admitted to our hospital but this is the worst presentation we have seen here.  Most recent medicines appear to have been lamotrigine and clonidine presumably as a replacement for her previous prazosin.  Risk to Self: Suicidal Ideation: Yes-Currently Present Suicidal Intent: Yes-Currently Present Is patient at risk for suicide?: Yes Suicidal Plan?: Yes-Currently Present Specify Current Suicidal Plan: Overdose on medications Access to Means: Yes Specify Access to Suicidal Means: Medications What has been your use of drugs/alcohol within the last 12 months?: Cannabis How many times?: 1 Other Self Harm Risks: None reported Triggers for Past Attempts: None known Intentional Self Injurious Behavior: None Risk to Others: Homicidal Ideation: No Thoughts of Harm to Others: No Current Homicidal Intent: No Current Homicidal Plan: No Access to Homicidal Means: No Identified Victim: None reported History of harm to others?: No Assessment  of Violence: None Noted Violent Behavior Description: None reported Does patient have access to weapons?: No Criminal Charges Pending?: No Does patient have a court date: No Prior Inpatient Therapy: Prior Inpatient Therapy: No Prior Therapy Dates:  n/a Prior Therapy Facilty/Provider(s): n/a Reason for Treatment: n/a Prior Outpatient Therapy: Prior Outpatient Therapy: Yes Prior Therapy Dates: current Prior Therapy Facilty/Provider(s): [facility] Reason for Treatment: depression Does patient have an ACCT team?: No Does patient have Intensive In-House Services?  : No Does patient have [facility] services? : No Does patient have P4CC services?: No  Past Medical History:  Past Medical History:  Diagnosis Date  . Anxiety   . Depression    History reviewed. No pertinent surgical history. Family History: No family history on file. Family Psychiatric  History: Substance abuse history and also a father who committed suicide whose diagnosis is unknown. Social History:  Social History   Substance and Sexual Activity  Alcohol Use Yes  . Alcohol/week: 0.6 oz  . Types: 1 Standard drinks or equivalent per week     Social History   Substance and Sexual Activity  Drug Use Yes  . Types: Marijuana    Social History   Socioeconomic History  . Marital status: Single    Spouse name: None  . Number of children: None  . Years of education: None  . Highest education level: None  Social Needs  . Financial resource strain: None  . Food insecurity - worry: None  . Food insecurity - inability: None  . Transportation needs - medical: None  . Transportation needs - non-medical: None  Occupational History  . None  Tobacco Use  . Smoking status: Unknown If Ever Smoked  Substance and Sexual Activity  . Alcohol use: Yes    Alcohol/week: 0.6 oz    Types: 1 Standard drinks or equivalent per week  . Drug use: Yes    Types: Marijuana  . Sexual activity: Yes    Birth control/protection: None  Other Topics Concern  . None  Social History Narrative  . None   Additional Social History:    Allergies:  No Known Allergies  Labs:  Results for orders placed or performed during the hospital encounter of 08/14/17 (from the past 48 hour(s))  CBC      Status: Abnormal   Collection Time: 08/14/17  5:44 AM  Result Value Ref Range   WBC 16.9 (H) 3.6 - 11.0 K/uL   RBC 4.80 3.80 - 5.20 MIL/uL   Hemoglobin 12.8 12.0 - 16.0 g/dL   HCT 40.0 35.0 - 47.0 %   MCV 83.4 80.0 - 100.0 fL   MCH 26.7 26.0 - 34.0 pg   MCHC 32.0 32.0 - 36.0 g/dL   RDW 15.3 (H) 11.5 - 14.5 %   Platelets 288 150 - 440 K/uL    Comment: Performed at [facility], [address] [place], [place] [address]  Comprehensive metabolic panel     Status: Abnormal   Collection Time: 08/14/17  5:44 AM  Result Value Ref Range   Sodium 137 135 - 145 mmol/L   Potassium 3.5 3.5 - 5.1 mmol/L   Chloride 106 101 - 111 mmol/L   CO2 26 22 - 32 mmol/L   Glucose, Bld 167 (H) 65 - 99 mg/dL   BUN 10 6 - 20 mg/dL   Creatinine, Ser 0.63 0.44 - 1.00 mg/dL   Calcium 9.3 8.9 - 10.3 mg/dL   Total Protein 7.7 6.5 - 8.1 g/dL   Albumin 4.0 3.5 -  5.0 g/dL   AST 28 15 - 41 U/L   ALT 25 14 - 54 U/L   Alkaline Phosphatase 65 38 - 126 U/L   Total Bilirubin 1.2 0.3 - 1.2 mg/dL   GFR calc non Af Amer >60 >60 mL/min   GFR calc Af Amer >60 >60 mL/min    Comment: (NOTE) The eGFR has been calculated using the CKD EPI equation. This calculation has not been validated in all clinical situations. eGFR's persistently <60 mL/min signify possible Chronic Kidney Disease.    Anion gap 5 5 - 15    Comment: Performed at [facility], [address] [place], [place] [address]  Magnesium     Status: Abnormal   Collection Time: 08/14/17  5:44 AM  Result Value Ref Range   Magnesium 1.6 (L) 1.7 - 2.4 mg/dL    Comment: Performed at [facility], [address] [place], [place] [address]  Phosphorus     Status: None   Collection Time: 08/14/17  5:44 AM  Result Value Ref Range   Phosphorus 3.4 2.5 - 4.6 mg/dL    Comment: Performed at [facility], [address] [place], [place] [address]  Acetaminophen level     Status: Abnormal   Collection Time: 08/14/17  5:44 AM   Result Value Ref Range   Acetaminophen (Tylenol), Serum <10 (L) 10 - 30 ug/mL    Comment:        THERAPEUTIC CONCENTRATIONS VARY SIGNIFICANTLY. A RANGE OF 10-30 ug/mL MAY BE AN EFFECTIVE CONCENTRATION FOR MANY PATIENTS. HOWEVER, SOME ARE BEST TREATED AT CONCENTRATIONS OUTSIDE THIS RANGE. ACETAMINOPHEN CONCENTRATIONS >150 ug/mL AT 4 HOURS AFTER INGESTION AND >50 ug/mL AT 12 HOURS AFTER INGESTION ARE OFTEN ASSOCIATED WITH TOXIC REACTIONS. Performed at [facility], [address] [place], [place] [address]   Salicylate level     Status: None   Collection Time: 08/14/17  5:44 AM  Result Value Ref Range   Salicylate Lvl <2.9 2.8 - 30.0 mg/dL    Comment: Performed at [facility], [address] [place], [place] [address]  Ethanol     Status: None   Collection Time: 08/14/17  5:44 AM  Result Value Ref Range   Alcohol, Ethyl (B) <10 <10 mg/dL    Comment:        LOWEST DETECTABLE LIMIT FOR SERUM ALCOHOL IS 10 mg/dL FOR MEDICAL PURPOSES ONLY Performed at [facility], [address], [place], [place] [address]   Urine Drug Screen, Qualitative ([place] only)     Status: Abnormal   Collection Time: 08/14/17  5:48 AM  Result Value Ref Range   Tricyclic, Ur Screen NONE DETECTED NONE DETECTED   Amphetamines, Ur Screen NONE DETECTED NONE DETECTED   MDMA (Ecstasy)Ur Screen NONE DETECTED NONE DETECTED   Cocaine Metabolite,Ur [place] NONE DETECTED NONE DETECTED   Opiate, Ur Screen NONE DETECTED NONE DETECTED   Phencyclidine (PCP) Ur S NONE DETECTED NONE DETECTED   Cannabinoid 50 Ng, Ur [place] POSITIVE (A) NONE DETECTED   Barbiturates, Ur Screen NONE DETECTED NONE DETECTED   Benzodiazepine, Ur Scrn NONE DETECTED NONE DETECTED   Methadone Scn, Ur NONE DETECTED NONE DETECTED    Comment: (NOTE) Tricyclics + metabolites, urine    Cutoff 1000 ng/mL Amphetamines + metabolites, urine  Cutoff 1000 ng/mL MDMA (Ecstasy), urine              Cutoff 500 ng/mL Cocaine  Metabolite, urine  Cutoff 300 ng/mL Opiate + metabolites, urine        Cutoff 300 ng/mL Phencyclidine (PCP), urine         Cutoff 25 ng/mL Cannabinoid, urine                 Cutoff 50 ng/mL Barbiturates + metabolites, urine  Cutoff 200 ng/mL Benzodiazepine, urine              Cutoff 200 ng/mL Methadone, urine                   Cutoff 300 ng/mL The urine drug screen provides only a preliminary, unconfirmed analytical test result and should not be used for non-medical purposes. Clinical consideration and professional judgment should be applied to any positive drug screen result due to possible interfering substances. A more specific alternate chemical method must be used in order to obtain a confirmed analytical result. Gas chromatography / mass spectrometry (GC/MS) is the preferred confirmat ory method. Performed at [facility], [address] [place], [place] [address]   Pregnancy, urine     Status: None   Collection Time: 08/14/17  9:20 AM  Result Value Ref Range   Preg Test, Ur NEGATIVE NEGATIVE    Comment: Performed at [facility], [address] [place], [place] [address]  Glucose, capillary     Status: Abnormal   Collection Time: 08/15/17 11:52 AM  Result Value Ref Range   Glucose-Capillary 100 (H) 65 - 99 mg/dL    Current Facility-Administered Medications  Medication Dose Route Frequency Provider Last Rate Last Dose  . ibuprofen (ADVIL,MOTRIN) tablet 800 mg  800 mg Oral Once Earleen Newport, MD   Stopped at 08/15/17 1138  . ondansetron (ZOFRAN-ODT) disintegrating tablet 4 mg  4 mg Oral Once Earleen Newport, MD   Stopped at 08/15/17 1120   Current Outpatient Medications  Medication Sig Dispense Refill  . cloNIDine (CATAPRES) 0.2 MG tablet Take 0.2 mg by mouth at bedtime.  0  . lamoTRIgine (LAMICTAL) 25 MG tablet Take 75 mg by mouth daily.  0  . MILI 0.25-35 MG-MCG tablet Take 1 tablet by mouth daily.   1  . prazosin (MINIPRESS) 1  MG capsule Take 1 mg by mouth 2 (two) times daily.   0    Musculoskeletal: Strength & Muscle Tone: decreased Gait & Station: unsteady Patient leans: N/A  Psychiatric Specialty Exam: Physical Exam  Nursing note and vitals reviewed. Constitutional: She appears well-developed and well-nourished.  HENT:  Head: Normocephalic and atraumatic.  Eyes: Conjunctivae are normal. Pupils are equal, round, and reactive to light.  Neck: Normal range of motion.  Cardiovascular: Regular rhythm and normal heart sounds.  Respiratory: Effort normal. No respiratory distress.  GI: Soft.  Musculoskeletal: Normal range of motion.  Neurological: She is alert.  Skin: Skin is warm and dry.  Psychiatric: Her affect is labile and inappropriate. Her speech is tangential. She is agitated and aggressive. Thought content is paranoid. Cognition and memory are impaired. She expresses impulsivity and inappropriate judgment. She is noncommunicative.    Review of Systems  Constitutional: Negative.   HENT: Negative.   Eyes: Negative.   Respiratory: Negative.   Cardiovascular: Negative.   Gastrointestinal: Negative.   Musculoskeletal: Positive for neck pain.  Skin: Negative.   Neurological: Positive for headaches.    Blood pressure (!) 150/81, pulse 96, temperature 99 F (37.2 C), temperature source Oral, resp. rate (!) 22, height 5' 9"  (1.753 m), weight 67.1 kg (148  lb), SpO2 100 %.Body mass index is 21.86 kg/m.  General Appearance: Disheveled  Eye Contact:  None  Speech:  Garbled  Volume:  Increased  Mood:  Angry, Dysphoric and Irritable  Affect:  Inappropriate and Labile  Thought Process:  Disorganized  Orientation:  Other:  Not clear she was not answering those sort of questions today  Thought Content:  Paranoid Ideation, Rumination and Tangential  Suicidal Thoughts:  Denies but is hard to have a reasonable conversation with  Homicidal Thoughts:  No  Memory:  Negative  Judgement:  Impaired  Insight:   Shallow  Psychomotor Activity:  Restlessness  Concentration:  Concentration: Poor  Recall:  Poor  Fund of Knowledge:  Fair  Language:  Fair  Akathisia:  No  Handed:  Right  AIMS (if indicated):     Assets:  Housing Resilience  ADL's:  Impaired  Cognition:  Impaired,  Mild and Moderate  Sleep:        Treatment Plan Summary: Daily contact with patient to assess and evaluate symptoms and progress in treatment, Medication management and Plan 22 year old woman with a history of chronic unstable mood disorder with a diagnosis of mood disorder possible borderline personality disorder possible posttraumatic stress disorder.  Continues to be agitated disorganized difficult to work with uncooperative and at times dangerous this morning.  Unclear if this is the effects of medication, delirium or ongoing symptoms of her mood disorder.  Physically appears to be stable enough for transfer.  Still needs inpatient hospitalization.  [facility] has [facility] patient for transfer.  Discussed plan with emergency room staff.  Patient will be transferred to [facility] today.  PRN medication can be administered prior to transfer if necessary to assist with safety.  Patient has been informed of the plan although it is unclear whether she is listening and thinking clearly enough to understand it.  Disposition: Recommend psychiatric Inpatient admission when medically cleared.  Alethia Berthold, MD 08/15/2017 1:13 PM

## 2017-08-15 NOTE — ED Notes (Addendum)
Pt taken to CT, escorted by Officer Nunzio CoryLyons, ODS and Lorin PicketScott, EDT.

## 2017-08-15 NOTE — ED Notes (Signed)
Pt. Alert and oriented, warm and dry, in no distress. Pt. Denies SI, HI, and AVH. Pt is 1:1 for safety. Patient is stating she is wanting to go home. This Clinical research associatewriter explained to patient she would not be going home until she is safe. Pt. Encouraged to let nursing staff know of any concerns or needs.

## 2017-08-15 NOTE — ED Notes (Signed)
Pt. Awake assisted to bedside toilet without incidence.  Pt. Requested and given some water to drink.

## 2017-08-15 NOTE — ED Notes (Signed)
Pt returned from CT with Officer Nunzio CoryLyons, ODS and [place], EDT.

## 2017-08-15 NOTE — ED Notes (Signed)
Pt accepted to [facility] hospital/transportation called.

## 2017-08-15 NOTE — ED Provider Notes (Signed)
Patient is much more calm after Haldol.  She is been accepted to [facility] by Dr. Zeb Comfortornwall   Williams, Cecille AmsterdamJonathan E, MD 08/15/17 [phone]
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
## 2017-08-15 NOTE — ED Notes (Signed)
This RN attempted to get patient up and ambulate patient. Pt was noted to be a max 2 person assist. Jerilynn Somalvin, TTS to bedside to assist with evaluation of safety of patient to go to [facility]. This RN, Jerilynn Somalvin, RN, Dr. Lenard LancePaduchowski, and Tammy SoursGreg, RN discussed plan of care about patient and patient safety in regards to transfer. Due to patient being a max 2 person assist for ambulation, it was decided patient unsafe for transfer and Dr. Lenard LancePaduchowski went to bedside to evaluate patient. CT head placed due to continued nystagmus.

## 2017-08-15 NOTE — ED Notes (Signed)
Per RN, pt unable to transfer due to status change.

## 2017-08-15 NOTE — ED Notes (Addendum)
Pt handed phone and tried to call fiance 3 times. He did not answer the phone.

## 2017-08-16 MED ORDER — ONDANSETRON 4 MG PO TBDP
4.0000 mg | ORAL_TABLET | Freq: Once | ORAL | Status: AC
  Administered 2017-08-16: 4 mg via ORAL
  Filled 2017-08-16: qty 1

## 2017-08-16 NOTE — ED Notes (Signed)
Patient complaining of nausea. Verbal order for zofran ODT 4mg  received.

## 2017-08-16 NOTE — ED Notes (Signed)
Discharge instructions given to patient. Belongings given to patient.

## 2017-08-16 NOTE — ED Provider Notes (Signed)
I discussed face-to-face with Dr. Toni Amendlapacs at around 130.  He is recommending the patient be discharged home, stable psychiatrically.  He is going to release her from involuntary commitment.  I did fill out her discharge instructions and follow-up precautions paperwork for her.   Governor RooksLord, Embree Brawley, MD 08/16/17 1332

## 2017-08-16 NOTE — Discharge Instructions (Signed)
You are evaluated after drug overdose and found to have nasal bone fracture.  Please follow-up with psychiatry and therapist, if you do not have one, follow-up with [facility].  Return to emergency department immediately for any worsening condition including depression or thoughts of wanting to hurt yourself or others or any thought of overdose or overdose, or any other symptoms concerning to you.  For nasal bone fracture, this should heal up on its own.  If you are not satisfied with the cosmetic result, after it is healed you could seek reevaluation with a ears nose throat or plastic surgeon but most heal up just fine.

## 2017-08-16 NOTE — ED Notes (Signed)
Pt complaining of nausea, requesting zofran.  Verbal order received from Dr. Shaune PollackLord.

## 2017-08-16 NOTE — Consult Note (Signed)
[name]   Reason for Consult: Follow-up for 22 year old woman with a history of mood instability who has been in the emergency room a couple days recovering from an overdose Referring Physician: Reita Cliche Patient Identification: Stacey Jensen MRN:  [id number] Principal Diagnosis: Moderate recurrent major depression ([place]) Diagnosis:   Patient Active Problem List   Diagnosis Date Noted  . Overdose [T50.901A] 08/14/2017  . PTSD (post-traumatic stress disorder) [F43.10] 07/25/2017  . Moderate recurrent major depression ([place]) [F33.1] 07/25/2017  . Borderline personality disorder ([place]) [F60.3] 07/25/2017  . Cyclic vomiting syndrome [G43.A0] 07/25/2017  . Cannabis abuse [F12.10] 07/25/2017    Total Time spent with patient: 20 minutes  Subjective:   Stacey Jensen is a 22 y.o. female patient admitted with "I never did want to kill myself".  HPI: Patient interviewed chart reviewed.  See previous notes.  Patient came into the ER a couple days ago after having overdosed on her lamotrigine and clonidine.  She has consistently stated that she never wanted to kill herself but for the last couple days has been delirious and agitated.  Difficult to manage.  Often requiring sedating medicine.  We had tried referring her to [facility] yesterday but she was physically too sedated for transport.  On re-interview today the patient is much better.  She is sitting up and conversant and lucid.  Remembers everything about the hospitalization.  Still denies ever having wanted to kill herself.  Admits that it was poor judgment to overdose on her medicine but said that she just wanted her boyfriend to realize how bad she was feeling.  She is not reporting any hallucinations now.  Her affect is calm and appropriate and back to normal.  Does not feel hopeless.  Shows some better insight.  She still a little unsteady on her feet but has been able to manage not to fall.  Past Psychiatric History: History  of mood instability with what appears to me to be a borderline personality kind of pattern of lots of impulsivity rapid mood changes extreme sensitivity to interpersonal relationships.  Was going to [facility].  Seems to have dropped out of care there.  Risk to Self: Suicidal Ideation: Yes-Currently Present Suicidal Intent: Yes-Currently Present Is patient at risk for suicide?: Yes Suicidal Plan?: Yes-Currently Present Specify Current Suicidal Plan: Overdose on medications Access to Means: Yes Specify Access to Suicidal Means: Medications What has been your use of drugs/alcohol within the last 12 months?: Cannabis How many times?: 1 Other Self Harm Risks: None reported Triggers for Past Attempts: None known Intentional Self Injurious Behavior: None Risk to Others: Homicidal Ideation: No Thoughts of Harm to Others: No Current Homicidal Intent: No Current Homicidal Plan: No Access to Homicidal Means: No Identified Victim: None reported History of harm to others?: No Assessment of Violence: None Noted Violent Behavior Description: None reported Does patient have access to weapons?: No Criminal Charges Pending?: No Does patient have a court date: No Prior Inpatient Therapy: Prior Inpatient Therapy: No Prior Therapy Dates: n/a Prior Therapy Facilty/Provider(s): n/a Reason for Treatment: n/a Prior Outpatient Therapy: Prior Outpatient Therapy: Yes Prior Therapy Dates: current Prior Therapy Facilty/Provider(s): [facility] Reason for Treatment: depression Does patient have an ACCT team?: No Does patient have Intensive In-House Services?  : No Does patient have [facility] services? : No Does patient have P4CC services?: No  Past Medical History:  Past Medical History:  Diagnosis Date  . Anxiety   . Depression    History reviewed. No pertinent  surgical history. Family History: No family history on file. Family Psychiatric  History: Father killed himself Social History:  Social History    Substance and Sexual Activity  Alcohol Use Yes  . Alcohol/week: 0.6 oz  . Types: 1 Standard drinks or equivalent per week     Social History   Substance and Sexual Activity  Drug Use Yes  . Types: Marijuana    Social History   Socioeconomic History  . Marital status: Single    Spouse name: None  . Number of children: None  . Years of education: None  . Highest education level: None  Social Needs  . Financial resource strain: None  . Food insecurity - worry: None  . Food insecurity - inability: None  . Transportation needs - medical: None  . Transportation needs - non-medical: None  Occupational History  . None  Tobacco Use  . Smoking status: Unknown If Ever Smoked  Substance and Sexual Activity  . Alcohol use: Yes    Alcohol/week: 0.6 oz    Types: 1 Standard drinks or equivalent per week  . Drug use: Yes    Types: Marijuana  . Sexual activity: Yes    Birth control/protection: None  Other Topics Concern  . None  Social History Narrative  . None   Additional Social History:    Allergies:  No Known Allergies  Labs:  Results for orders placed or performed during the hospital encounter of 08/14/17 (from the past 48 hour(s))  Glucose, capillary     Status: Abnormal   Collection Time: 08/15/17 11:52 AM  Result Value Ref Range   Glucose-Capillary 100 (H) 65 - 99 mg/dL  CBC with Differential/Platelet     Status: Abnormal   Collection Time: 08/15/17  1:00 PM  Result Value Ref Range   WBC 12.8 (H) 3.6 - 11.0 K/uL   RBC 4.71 3.80 - 5.20 MIL/uL   Hemoglobin 12.7 12.0 - 16.0 g/dL   HCT 39.3 35.0 - 47.0 %   MCV 83.4 80.0 - 100.0 fL   MCH 26.9 26.0 - 34.0 pg   MCHC 32.3 32.0 - 36.0 g/dL   RDW 15.4 (H) 11.5 - 14.5 %   Platelets 227 150 - 440 K/uL   Neutrophils Relative % 77 %   Neutro Abs 9.8 (H) 1.4 - 6.5 K/uL   Lymphocytes Relative 16 %   Lymphs Abs 2.0 1.0 - 3.6 K/uL   Monocytes Relative 7 %   Monocytes Absolute 0.9 0.2 - 0.9 K/uL   Eosinophils Relative 0  %   Eosinophils Absolute 0.0 0 - 0.7 K/uL   Basophils Relative 0 %   Basophils Absolute 0.0 0 - 0.1 K/uL    Comment: Performed at [facility], [address] [place], [place] [address]  Comprehensive metabolic panel     Status: Abnormal   Collection Time: 08/15/17  1:00 PM  Result Value Ref Range   Sodium 137 135 - 145 mmol/L   Potassium 3.6 3.5 - 5.1 mmol/L   Chloride 104 101 - 111 mmol/L   CO2 20 (L) 22 - 32 mmol/L   Glucose, Bld 107 (H) 65 - 99 mg/dL   BUN 7 6 - 20 mg/dL   Creatinine, Ser 0.63 0.44 - 1.00 mg/dL   Calcium 8.8 (L) 8.9 - 10.3 mg/dL   Total Protein 7.7 6.5 - 8.1 g/dL   Albumin 4.1 3.5 - 5.0 g/dL   AST 24 15 - 41 U/L   ALT 23 14 - 54 U/L  Alkaline Phosphatase 73 38 - 126 U/L   Total Bilirubin 1.8 (H) 0.3 - 1.2 mg/dL   GFR calc non Af Amer >60 >60 mL/min   GFR calc Af Amer >60 >60 mL/min    Comment: (NOTE) The eGFR has been calculated using the CKD EPI equation. This calculation has not been validated in all clinical situations. eGFR's persistently <60 mL/min signify possible Chronic Kidney Disease.    Anion gap 13 5 - 15    Comment: Performed at [facility], [address] [place], [place] [address]    Current Facility-Administered Medications  Medication Dose Route Frequency Provider Last Rate Last Dose  . ibuprofen (ADVIL,MOTRIN) tablet 800 mg  800 mg Oral Once Earleen Newport, MD   Stopped at 08/15/17 1138   Current Outpatient Medications  Medication Sig Dispense Refill  . cloNIDine (CATAPRES) 0.2 MG tablet Take 0.2 mg by mouth at bedtime.  0  . lamoTRIgine (LAMICTAL) 25 MG tablet Take 75 mg by mouth daily.  0  . MILI 0.25-35 MG-MCG tablet Take 1 tablet by mouth daily.   1  . prazosin (MINIPRESS) 1 MG capsule Take 1 mg by mouth 2 (two) times daily.   0    Musculoskeletal: Strength & Muscle Tone: decreased Gait & Station: unsteady Patient leans: N/A  Psychiatric Specialty Exam: Physical Exam  Nursing note and vitals  reviewed. Constitutional: She appears well-developed and well-nourished.  HENT:  Head: Normocephalic and atraumatic.  Eyes: Conjunctivae are normal. Pupils are equal, round, and reactive to light.  Neck: Normal range of motion.  Cardiovascular: Regular rhythm and normal heart sounds.  Respiratory: Effort normal. No respiratory distress.  GI: Soft.  Musculoskeletal: Normal range of motion.  Neurological: She is alert.  Skin: Skin is warm and dry.  Psychiatric: She has a normal mood and affect. Judgment normal. Her speech is delayed. She is slowed. Thought content is not paranoid. Cognition and memory are normal. She expresses no homicidal and no suicidal ideation.    Review of Systems  Constitutional: Negative.   HENT: Negative.   Eyes: Negative.   Respiratory: Negative.   Cardiovascular: Negative.   Gastrointestinal: Negative.   Musculoskeletal: Negative.   Skin: Negative.   Neurological: Negative.   Psychiatric/Behavioral: Positive for memory loss. Negative for depression, hallucinations, substance abuse and suicidal ideas. The patient is nervous/anxious. The patient does not have insomnia.     Blood pressure 129/70, pulse 91, temperature 97.8 F (36.6 C), temperature source Oral, resp. rate 18, height 5' 9"  (1.753 m), weight 67.1 kg (148 lb), SpO2 99 %.Body mass index is 21.86 kg/m.  General Appearance: Disheveled  Eye Contact:  Good  Speech:  Clear and Coherent  Volume:  Normal  Mood:  Euthymic  Affect:  Congruent  Thought Process:  Goal Directed  Orientation:  Full (Time, Place, and Person)  Thought Content:  Logical  Suicidal Thoughts:  No  Homicidal Thoughts:  No  Memory:  Immediate;   Fair Recent;   Fair Remote;   Fair  Judgement:  Fair  Insight:  Fair  Psychomotor Activity:  Decreased  Concentration:  Concentration: Fair  Recall:  [employer] of Knowledge:  Fair  Language:  Fair  Akathisia:  No  Handed:  Right  AIMS (if indicated):     Assets:   Communication Skills Desire for Improvement Housing Resilience Social Support  ADL's:  Impaired  Cognition:  WNL  Sleep:        Treatment Plan Summary: Plan 22 year old woman  who had been delirious from the overdose noted above finally seems to have come through the other side.  Today she is lucid calm and appropriate.  Not showing the inappropriate hostility of yesterday.  Shows improved insight.  Still consistently denies suicidal ideation.  She is able to identify how it is probably better for her to not take psychiatric medicine particularly if it is not clearly helping since she is at risk of miss use of medicine.  Fortunately she now has no more psychiatric medicine at home to take.  At this point I do not think she requires inpatient treatment anymore.  I have released the involuntary commitment.  Case reviewed with emergency room physician and TTS.  Recommend patient go back to [facility] and we can also give her information about [facility] as well.  Patient agrees to plan.  Disposition: No evidence of imminent risk to self or others at present.   Patient does not meet criteria for psychiatric inpatient admission. Supportive therapy provided about ongoing stressors. Discussed crisis plan, support from social network, calling 911, coming to the Emergency Department, and calling Suicide Hotline.  Alethia Berthold, MD 08/16/2017 2:02 PM

## 2017-08-16 NOTE — ED Provider Notes (Signed)
Vitals:   08/15/17 2252 08/16/17 0945  BP: 131/65 129/70  Pulse: 85 91  Resp: 18 18  Temp: 98.6 F (37 C) 97.8 F (36.6 C)  SpO2: 100% 99%     No acute events reported to me from overnight nursing or physician report.  Patient is under IVC for intentional overdose, seen and recommended for psych admission by Dr. Toni Amendlapacs on 3/6, [facility] but then apparently delayed and bed not held.  Other dispo options being worked on by TTS.    Governor RooksLord, Joshus Rogan, MD 08/16/17 1106

## 2017-08-19 ENCOUNTER — Emergency Department
Admission: EM | Admit: 2017-08-19 | Discharge: 2017-08-19 | Discharge status: Against Medical Advice | Payer: Medicaid Other

## 2017-08-19 NOTE — ED Notes (Signed)
No answer when called several times from lobby 
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
## 2020-11-22 ENCOUNTER — Emergency Department (HOSPITAL_COMMUNITY)
Admission: EM | Admit: 2020-11-22 | Discharge: 2020-11-22 | Disposition: A | Discharge status: Home / Self Care | Payer: Medicaid Other | Attending: Emergency Medicine | Admitting: Emergency Medicine

## 2020-11-22 ENCOUNTER — Emergency Department (HOSPITAL_COMMUNITY): Payer: Medicaid Other

## 2020-11-22 ENCOUNTER — Other Ambulatory Visit: Payer: Self-pay

## 2020-11-22 DIAGNOSIS — R000 Tachycardia, unspecified: Secondary | ICD-10-CM | POA: Insufficient documentation

## 2020-11-22 DIAGNOSIS — S0512XA Contusion of eyeball and orbital tissues, left eye, initial encounter: Secondary | ICD-10-CM | POA: Insufficient documentation

## 2020-11-22 DIAGNOSIS — Y902 Blood alcohol level of 40-59 mg/100 ml: Secondary | ICD-10-CM | POA: Insufficient documentation

## 2020-11-22 DIAGNOSIS — S0990XA Unspecified injury of head, initial encounter: Secondary | ICD-10-CM

## 2020-11-22 DIAGNOSIS — F10129 Alcohol abuse with intoxication, unspecified: Secondary | ICD-10-CM | POA: Insufficient documentation

## 2020-11-22 DIAGNOSIS — Z20822 Contact with and (suspected) exposure to covid-19: Secondary | ICD-10-CM | POA: Insufficient documentation

## 2020-11-22 DIAGNOSIS — S50812A Abrasion of left forearm, initial encounter: Secondary | ICD-10-CM | POA: Insufficient documentation

## 2020-11-22 DIAGNOSIS — Z23 Encounter for immunization: Secondary | ICD-10-CM | POA: Insufficient documentation

## 2020-11-22 DIAGNOSIS — S022XXA Fracture of nasal bones, initial encounter for closed fracture: Secondary | ICD-10-CM | POA: Insufficient documentation

## 2020-11-22 LAB — COMPREHENSIVE METABOLIC PANEL
ALT: 19 U/L (ref 0–44)
AST: 32 U/L (ref 15–41)
Albumin: 4 g/dL (ref 3.5–5.0)
Alkaline Phosphatase: 77 U/L (ref 38–126)
Anion gap: 9 (ref 5–15)
BUN: 6 mg/dL (ref 6–20)
CO2: 24 mmol/L (ref 22–32)
Calcium: 9.2 mg/dL (ref 8.9–10.3)
Chloride: 104 mmol/L (ref 98–111)
Creatinine, Ser: 0.67 mg/dL (ref 0.44–1.00)
GFR, Estimated: >60 mL/min (ref >60)
Glucose, Bld: 102 mg/dL — ABNORMAL HIGH (ref 70–99)
Potassium: 3.9 mmol/L (ref 3.5–5.1)
Sodium: 137 mmol/L (ref 135–145)
Total Bilirubin: 0.6 mg/dL (ref 0.3–1.2)
Total Protein: 7.5 g/dL (ref 6.5–8.1)

## 2020-11-22 LAB — I-STAT BETA HCG BLOOD, ED (MC, WL, AP ONLY): I-stat hCG, quantitative: <5 m[IU]/mL (ref <5)

## 2020-11-22 LAB — RAPID URINE DRUG SCREEN, HOSP PERFORMED
Amphetamines: NOT DETECTED
Barbiturates: NOT DETECTED
Benzodiazepines: POSITIVE — AB
Cocaine: POSITIVE — AB
Opiates: NOT DETECTED
Tetrahydrocannabinol: POSITIVE — AB

## 2020-11-22 LAB — URINALYSIS, ROUTINE W REFLEX MICROSCOPIC
Bacteria, UA: NONE SEEN
Bilirubin Urine: NEGATIVE
Glucose, UA: NEGATIVE mg/dL
Ketones, ur: NEGATIVE mg/dL
Leukocytes,Ua: NEGATIVE
Nitrite: NEGATIVE
Protein, ur: NEGATIVE mg/dL
Specific Gravity, Urine: 1.011 (ref 1.005–1.030)
pH: 6 (ref 5.0–8.0)

## 2020-11-22 LAB — CBC
HCT: 39.9 % (ref 36.0–46.0)
Hemoglobin: 12.6 g/dL (ref 12.0–15.0)
MCH: 26.7 pg (ref 26.0–34.0)
MCHC: 31.6 g/dL (ref 30.0–36.0)
MCV: 84.5 fL (ref 80.0–100.0)
Platelets: 250 10*3/uL (ref 150–400)
RBC: 4.72 MIL/uL (ref 3.87–5.11)
RDW: 14.3 % (ref 11.5–15.5)
WBC: 14.1 10*3/uL — ABNORMAL HIGH (ref 4.0–10.5)
nRBC: 0 % (ref 0.0–0.2)

## 2020-11-22 LAB — RESP PANEL BY RT-PCR (FLU A&B, COVID) ARPGX2
Influenza A by PCR: NEGATIVE
Influenza B by PCR: NEGATIVE
SARS Coronavirus 2 by RT PCR: NEGATIVE

## 2020-11-22 LAB — ETHANOL: Alcohol, Ethyl (B): 55 mg/dL — ABNORMAL HIGH (ref <10)

## 2020-11-22 LAB — PROTIME-INR
INR: 1.1 (ref 0.8–1.2)
Prothrombin Time: 14.5 seconds (ref 11.4–15.2)

## 2020-11-22 LAB — SAMPLE TO BLOOD BANK

## 2020-11-22 LAB — LACTIC ACID, PLASMA: Lactic Acid, Venous: 2.4 mmol/L (ref 0.5–1.9)

## 2020-11-22 MED ORDER — TETANUS-DIPHTH-ACELL PERTUSSIS 5-2.5-18.5 LF-MCG/0.5 IM SUSY
0.5000 mL | PREFILLED_SYRINGE | Freq: Once | INTRAMUSCULAR | Status: AC
  Administered 2020-11-22: 0.5 mL via INTRAMUSCULAR
  Filled 2020-11-22: qty 0.5

## 2020-11-22 MED ORDER — SODIUM CHLORIDE 0.9 % IV SOLN: INTRAVENOUS | Status: DC

## 2020-11-22 MED ORDER — SODIUM CHLORIDE 0.9 % IV BOLUS
1000.0000 mL | Freq: Once | INTRAVENOUS | Status: AC
  Administered 2020-11-22: 1000 mL via INTRAVENOUS
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
## 2020-11-22 NOTE — ED Notes (Signed)
DC Instructions reviewed with pt.  PT verbalized understanding.  PT DC. 

## 2020-11-22 NOTE — ED Notes (Signed)
Patient is resting comfortably. 
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
## 2020-11-22 NOTE — ED Notes (Signed)
Pt given water to drink, tolerated without difficulty.
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
